# Patient Record
Sex: Female | Born: 1955 | Race: White | Hispanic: No | Marital: Married | Smoking: Never smoker
Health system: Southern US, Community
[De-identification: ages and names within clinical notes are randomized; demographics above are authoritative.]

## PROBLEM LIST (undated history)

## (undated) DIAGNOSIS — R5382 Chronic fatigue, unspecified: Secondary | ICD-10-CM

## (undated) DIAGNOSIS — K589 Irritable bowel syndrome without diarrhea: Secondary | ICD-10-CM

## (undated) DIAGNOSIS — K219 Gastro-esophageal reflux disease without esophagitis: Secondary | ICD-10-CM

## (undated) DIAGNOSIS — M549 Dorsalgia, unspecified: Secondary | ICD-10-CM

## (undated) DIAGNOSIS — E039 Hypothyroidism, unspecified: Secondary | ICD-10-CM

## (undated) DIAGNOSIS — K2950 Unspecified chronic gastritis without bleeding: Secondary | ICD-10-CM

## (undated) DIAGNOSIS — I10 Essential (primary) hypertension: Secondary | ICD-10-CM

## (undated) DIAGNOSIS — F329 Major depressive disorder, single episode, unspecified: Secondary | ICD-10-CM

## (undated) DIAGNOSIS — M797 Fibromyalgia: Secondary | ICD-10-CM

## (undated) DIAGNOSIS — R413 Other amnesia: Secondary | ICD-10-CM

## (undated) DIAGNOSIS — K222 Esophageal obstruction: Secondary | ICD-10-CM

## (undated) DIAGNOSIS — K760 Fatty (change of) liver, not elsewhere classified: Secondary | ICD-10-CM

## (undated) DIAGNOSIS — C73 Malignant neoplasm of thyroid gland: Secondary | ICD-10-CM

## (undated) DIAGNOSIS — F419 Anxiety disorder, unspecified: Secondary | ICD-10-CM

## (undated) DIAGNOSIS — G8929 Other chronic pain: Secondary | ICD-10-CM

## (undated) DIAGNOSIS — E119 Type 2 diabetes mellitus without complications: Secondary | ICD-10-CM

## (undated) DIAGNOSIS — G250 Essential tremor: Secondary | ICD-10-CM

## (undated) DIAGNOSIS — R61 Generalized hyperhidrosis: Secondary | ICD-10-CM

## (undated) DIAGNOSIS — F32A Depression, unspecified: Secondary | ICD-10-CM

## (undated) DIAGNOSIS — E785 Hyperlipidemia, unspecified: Secondary | ICD-10-CM

## (undated) DIAGNOSIS — G894 Chronic pain syndrome: Secondary | ICD-10-CM

## (undated) DIAGNOSIS — M3500 Sicca syndrome, unspecified: Secondary | ICD-10-CM

## (undated) HISTORY — PX: OTHER SURGICAL HISTORY: SHX169

## (undated) HISTORY — DX: Gastro-esophageal reflux disease without esophagitis: K21.9

## (undated) HISTORY — DX: Irritable bowel syndrome, unspecified: K58.9

## (undated) HISTORY — DX: Essential (primary) hypertension: I10

## (undated) HISTORY — DX: Dorsalgia, unspecified: M54.9

## (undated) HISTORY — DX: Gilbert syndrome: E80.4

## (undated) HISTORY — DX: Major depressive disorder, single episode, unspecified: F32.9

## (undated) HISTORY — DX: Chronic pain syndrome: G89.4

## (undated) HISTORY — DX: Essential tremor: G25.0

## (undated) HISTORY — DX: Malignant neoplasm of thyroid gland: C73

## (undated) HISTORY — DX: Other chronic pain: G89.29

## (undated) HISTORY — DX: Fatty (change of) liver, not elsewhere classified: K76.0

## (undated) HISTORY — DX: Type 2 diabetes mellitus without complications: E11.9

## (undated) HISTORY — DX: Depression, unspecified: F32.A

## (undated) HISTORY — DX: Esophageal obstruction: K22.2

## (undated) HISTORY — DX: Anxiety disorder, unspecified: F41.9

## (undated) HISTORY — DX: Unspecified chronic gastritis without bleeding: K29.50

## (undated) HISTORY — DX: Other amnesia: R41.3

## (undated) HISTORY — DX: Hyperlipidemia, unspecified: E78.5

## (undated) HISTORY — DX: Chronic fatigue, unspecified: R53.82

## (undated) HISTORY — PX: TONSILLECTOMY: SUR1361

## (undated) HISTORY — DX: Sjogren syndrome, unspecified: M35.00

## (undated) HISTORY — PX: LAPAROSCOPY: SHX197

## (undated) HISTORY — DX: Hypothyroidism, unspecified: E03.9

## (undated) HISTORY — DX: Generalized hyperhidrosis: R61

## (undated) HISTORY — DX: Fibromyalgia: M79.7

---

## 1980-01-02 HISTORY — PX: CHOLECYSTECTOMY: SHX55

## 1997-04-28 ENCOUNTER — Other Ambulatory Visit: Admission: RE | Admit: 1997-04-28 | Discharge: 1997-04-28 | Payer: Self-pay | Admitting: *Deleted

## 1998-04-06 ENCOUNTER — Ambulatory Visit (HOSPITAL_COMMUNITY): Admission: RE | Admit: 1998-04-06 | Discharge: 1998-04-06 | Payer: Self-pay | Admitting: Family Medicine

## 1998-04-06 ENCOUNTER — Encounter: Payer: Self-pay | Admitting: Family Medicine

## 1998-05-25 ENCOUNTER — Other Ambulatory Visit: Admission: RE | Admit: 1998-05-25 | Discharge: 1998-05-25 | Payer: Self-pay | Admitting: *Deleted

## 1999-05-25 ENCOUNTER — Other Ambulatory Visit: Admission: RE | Admit: 1999-05-25 | Discharge: 1999-05-25 | Payer: Self-pay | Admitting: *Deleted

## 1999-09-08 ENCOUNTER — Encounter: Payer: Self-pay | Admitting: Family Medicine

## 1999-09-08 ENCOUNTER — Ambulatory Visit (HOSPITAL_COMMUNITY): Admission: RE | Admit: 1999-09-08 | Discharge: 1999-09-08 | Payer: Self-pay | Admitting: Family Medicine

## 1999-12-05 ENCOUNTER — Ambulatory Visit (HOSPITAL_COMMUNITY): Admission: RE | Admit: 1999-12-05 | Discharge: 1999-12-05 | Payer: Self-pay | Admitting: Gastroenterology

## 2000-02-08 ENCOUNTER — Encounter: Admission: RE | Admit: 2000-02-08 | Discharge: 2000-02-08 | Payer: Self-pay | Admitting: Family Medicine

## 2000-02-08 ENCOUNTER — Encounter: Payer: Self-pay | Admitting: Family Medicine

## 2000-02-22 ENCOUNTER — Encounter: Payer: Self-pay | Admitting: Neurosurgery

## 2000-02-23 ENCOUNTER — Inpatient Hospital Stay (HOSPITAL_COMMUNITY): Admission: RE | Admit: 2000-02-23 | Discharge: 2000-02-27 | Payer: Self-pay | Admitting: Neurosurgery

## 2000-02-23 ENCOUNTER — Encounter: Payer: Self-pay | Admitting: Neurosurgery

## 2000-02-26 ENCOUNTER — Encounter: Payer: Self-pay | Admitting: Neurosurgery

## 2000-04-22 ENCOUNTER — Encounter: Payer: Self-pay | Admitting: Neurosurgery

## 2000-04-22 ENCOUNTER — Encounter: Admission: RE | Admit: 2000-04-22 | Discharge: 2000-04-22 | Payer: Self-pay | Admitting: Neurosurgery

## 2000-05-28 ENCOUNTER — Encounter: Admission: RE | Admit: 2000-05-28 | Discharge: 2000-05-28 | Payer: Self-pay | Admitting: Neurosurgery

## 2000-05-28 ENCOUNTER — Encounter: Payer: Self-pay | Admitting: Neurosurgery

## 2000-05-29 ENCOUNTER — Other Ambulatory Visit: Admission: RE | Admit: 2000-05-29 | Discharge: 2000-05-29 | Payer: Self-pay | Admitting: *Deleted

## 2000-06-05 ENCOUNTER — Emergency Department (HOSPITAL_COMMUNITY): Admission: EM | Admit: 2000-06-05 | Discharge: 2000-06-06 | Payer: Self-pay | Admitting: Emergency Medicine

## 2000-09-26 ENCOUNTER — Encounter: Admission: RE | Admit: 2000-09-26 | Discharge: 2000-09-26 | Payer: Self-pay | Admitting: Neurosurgery

## 2000-09-26 ENCOUNTER — Encounter: Payer: Self-pay | Admitting: Neurosurgery

## 2000-10-01 ENCOUNTER — Ambulatory Visit (HOSPITAL_COMMUNITY): Admission: RE | Admit: 2000-10-01 | Discharge: 2000-10-01 | Payer: Self-pay | Admitting: Family Medicine

## 2000-10-01 ENCOUNTER — Encounter: Payer: Self-pay | Admitting: Family Medicine

## 2001-02-12 ENCOUNTER — Encounter: Payer: Self-pay | Admitting: Family Medicine

## 2001-02-12 ENCOUNTER — Ambulatory Visit (HOSPITAL_COMMUNITY): Admission: RE | Admit: 2001-02-12 | Discharge: 2001-02-12 | Payer: Self-pay | Admitting: Family Medicine

## 2001-05-28 ENCOUNTER — Other Ambulatory Visit: Admission: RE | Admit: 2001-05-28 | Discharge: 2001-05-28 | Payer: Self-pay | Admitting: *Deleted

## 2002-10-19 ENCOUNTER — Other Ambulatory Visit: Admission: RE | Admit: 2002-10-19 | Discharge: 2002-10-19 | Payer: Self-pay | Admitting: *Deleted

## 2003-07-15 ENCOUNTER — Encounter: Admission: RE | Admit: 2003-07-15 | Discharge: 2003-07-15 | Payer: Self-pay | Admitting: Neurosurgery

## 2003-07-29 ENCOUNTER — Encounter: Admission: RE | Admit: 2003-07-29 | Discharge: 2003-07-29 | Payer: Self-pay | Admitting: Neurosurgery

## 2003-11-23 ENCOUNTER — Other Ambulatory Visit: Admission: RE | Admit: 2003-11-23 | Discharge: 2003-11-23 | Payer: Self-pay | Admitting: *Deleted

## 2003-12-03 ENCOUNTER — Encounter: Admission: RE | Admit: 2003-12-03 | Discharge: 2003-12-03 | Payer: Self-pay | Admitting: Neurosurgery

## 2004-01-02 DIAGNOSIS — K2950 Unspecified chronic gastritis without bleeding: Secondary | ICD-10-CM

## 2004-01-02 HISTORY — DX: Unspecified chronic gastritis without bleeding: K29.50

## 2004-01-07 ENCOUNTER — Ambulatory Visit: Payer: Self-pay | Admitting: Gastroenterology

## 2004-01-21 ENCOUNTER — Ambulatory Visit: Payer: Self-pay | Admitting: Gastroenterology

## 2004-04-19 ENCOUNTER — Encounter (INDEPENDENT_AMBULATORY_CARE_PROVIDER_SITE_OTHER): Payer: Self-pay | Admitting: Specialist

## 2004-04-19 ENCOUNTER — Ambulatory Visit (HOSPITAL_COMMUNITY): Admission: RE | Admit: 2004-04-19 | Discharge: 2004-04-19 | Payer: Self-pay | Admitting: Gastroenterology

## 2004-07-28 ENCOUNTER — Encounter: Admission: RE | Admit: 2004-07-28 | Discharge: 2004-07-28 | Payer: Self-pay | Admitting: Neurosurgery

## 2004-08-18 ENCOUNTER — Encounter: Admission: RE | Admit: 2004-08-18 | Discharge: 2004-08-18 | Payer: Self-pay | Admitting: Neurosurgery

## 2004-11-20 ENCOUNTER — Other Ambulatory Visit: Admission: RE | Admit: 2004-11-20 | Discharge: 2004-11-20 | Payer: Self-pay | Admitting: *Deleted

## 2005-01-01 DIAGNOSIS — C73 Malignant neoplasm of thyroid gland: Secondary | ICD-10-CM

## 2005-01-01 HISTORY — DX: Malignant neoplasm of thyroid gland: C73

## 2005-03-05 ENCOUNTER — Ambulatory Visit (HOSPITAL_COMMUNITY): Admission: RE | Admit: 2005-03-05 | Discharge: 2005-03-05 | Payer: Self-pay | Admitting: Neurosurgery

## 2005-03-06 ENCOUNTER — Ambulatory Visit (HOSPITAL_COMMUNITY): Admission: RE | Admit: 2005-03-06 | Discharge: 2005-03-06 | Payer: Self-pay | Admitting: Neurosurgery

## 2005-03-20 ENCOUNTER — Ambulatory Visit (HOSPITAL_COMMUNITY): Admission: RE | Admit: 2005-03-20 | Discharge: 2005-03-21 | Payer: Self-pay | Admitting: Neurosurgery

## 2005-08-01 ENCOUNTER — Other Ambulatory Visit: Admission: RE | Admit: 2005-08-01 | Discharge: 2005-08-01 | Payer: Self-pay | Admitting: Diagnostic Radiology

## 2006-01-15 ENCOUNTER — Encounter (INDEPENDENT_AMBULATORY_CARE_PROVIDER_SITE_OTHER): Payer: Self-pay | Admitting: Specialist

## 2006-01-15 ENCOUNTER — Ambulatory Visit (HOSPITAL_COMMUNITY): Admission: RE | Admit: 2006-01-15 | Discharge: 2006-01-15 | Payer: Self-pay | Admitting: *Deleted

## 2006-03-13 ENCOUNTER — Other Ambulatory Visit: Admission: RE | Admit: 2006-03-13 | Discharge: 2006-03-13 | Payer: Self-pay | Admitting: *Deleted

## 2007-11-21 ENCOUNTER — Ambulatory Visit: Payer: Self-pay | Admitting: Internal Medicine

## 2007-11-21 DIAGNOSIS — K219 Gastro-esophageal reflux disease without esophagitis: Secondary | ICD-10-CM | POA: Insufficient documentation

## 2007-11-21 DIAGNOSIS — J45909 Unspecified asthma, uncomplicated: Secondary | ICD-10-CM | POA: Insufficient documentation

## 2007-11-21 DIAGNOSIS — J309 Allergic rhinitis, unspecified: Secondary | ICD-10-CM | POA: Insufficient documentation

## 2007-12-06 DIAGNOSIS — I10 Essential (primary) hypertension: Secondary | ICD-10-CM | POA: Insufficient documentation

## 2007-12-06 DIAGNOSIS — E785 Hyperlipidemia, unspecified: Secondary | ICD-10-CM

## 2008-01-09 ENCOUNTER — Ambulatory Visit: Payer: Self-pay | Admitting: Internal Medicine

## 2008-10-27 ENCOUNTER — Encounter: Admission: RE | Admit: 2008-10-27 | Discharge: 2008-10-27 | Payer: Self-pay | Admitting: Family Medicine

## 2009-09-20 ENCOUNTER — Encounter: Admission: RE | Admit: 2009-09-20 | Discharge: 2009-09-20 | Payer: Self-pay | Admitting: Internal Medicine

## 2009-10-07 ENCOUNTER — Encounter: Admission: RE | Admit: 2009-10-07 | Discharge: 2009-10-07 | Payer: Self-pay | Admitting: Family Medicine

## 2010-01-01 HISTORY — PX: BLADDER SUSPENSION: SHX72

## 2010-01-22 ENCOUNTER — Encounter: Payer: Self-pay | Admitting: Neurosurgery

## 2010-05-19 NOTE — Discharge Summary (Signed)
[place]. [facility]  Patient:    Yvette Diaz, Yvette Diaz                        MRN: [id number] Adm. Date:  [id number] Disc. Date: [id number] Attending:  Danella Penton                           Discharge Summary  ADMISSION DIAGNOSIS:  L5-S1 degenerative disk disease with radiculopathy, status post L5-S1 diskectomy.  FINAL DIAGNOSES:  L5-S1 degenerative disk disease with radiculopathy, status post L5-S1 diskectomy.  HISTORY OF PRESENT ILLNESS:  Ms. Udovich is a lady who underwent L5-S1 diskectomy many years ago by a neurosurgeon who retired since then.  She has been complaining of back pain with radiation to both lower extremities. X-rays showed that indeed she had no space between 5-1 and there was foraminal stenosis.  Surgery was advised.  LABORATORY DATA:  Normal.  HOSPITAL COURSE:  The patient was taken to surgery with bilateral L5-S1 diskectomy with interval fusion and pedicle screw was done.  Today the patient is doing much better.  She is ambulating and she is ready to go home. Pain is under control.  CONDITION ON DISCHARGE:  Improved.  DISCHARGE MEDICATIONS: 1. Percocet. 2. Diazepam.  She is going to use Dilaudid 2 mg only 20 tablets for extreme pain.  She knows to be careful about that and I explained about overdose.  FOLLOW-UP:  I will see her next week in my office.  DIET:  She is encouraged to lose weight.  ACTIVITY:  She is not to drive, but she was encouraged to walk. DD:  02/27/00 TD:  02/27/00 Job: [id number]

## 2010-05-19 NOTE — Procedures (Signed)
[place]. [facility]  Patient:    ELIN, FENLEY                        MRN: [id number] Proc. Date: 12/05/99 Adm. Date:  [id number] Disc. Date: [id number] Attending:  Orland Mustard CC:         Arvella Merles, M.D.   Procedure Report  DATE OF BIRTH:  Jul 04, 1955.  PROCEDURE PERFORMED:  Esophageal manometry.  ENDOSCOPIST:  Llana Aliment. Randa Evens, M.D.  INDICATIONS FOR PROCEDURE:  Burning in the throat, stomach and nausea, refractory to acid reduction.  DESCRIPTION OF PROCEDURE:  The procedure was performed in the [place] manometry lab using the usual protocol and [employer] equipment. Results are as follows:  1. Upper esophageal sphincter.  Positive pharyngeal spikes were present.  We    were unable to see the upper sphincter on the material provided for [place]. 2. Mean amplitude distal esophagus was 280 mmHg with a mean duration of 5.8    seconds, peristalsis was normal in all wet swallows presented. 3. Lower esophageal sphincter.  Mean pressure 50 mm.  The sphincter relaxes    with swallowing.  Residual pressure 2.2 mm, well within normal range.  ASSESSMENT:  Essentially normal manometry.  PLAN:  24-hour pH probe.  Follow-up office visit. DD:  12/13/99 TD:  12/13/99 Job: [id number]

## 2010-05-19 NOTE — Procedures (Signed)
[place]. [facility]  Patient:    Yvette Diaz, Yvette Diaz                        MRN: [id number] Proc. Date: 12/05/99 Adm. Date:  [id number] Disc. Date: [id number] Attending:  Orland Mustard CC:         Arvella Merles, M.D.   Procedure Report  DATE OF BIRTH:  Nov 06, 1955.  PROCEDURE:  Twenty-four-hour esophageal pH monitor.  ENDOSCOPIST:  Llana Aliment. Randa Evens, M.D.  INDICATION:  Patient has symptoms somewhat suggestive of reflux.  This is done to rule out esophageal reflux.  Patient had an esophageal manometry done prior to this procedure.  DESCRIPTION OF PROCEDURE:  The procedure was done according to the [facility] protocol.  The patient retained the 24-hour pH probe placed by manometric guidance into the esophagus for a total of 23 hours and 45 minutes.  Results were as follows: 1. Fifty-four episodes of reflux in the upright position for a total of    17 minutes during 11 hours and 24 minutes of upright position, a total of    2.5% of the time, within normal range. 2. Thirty episodes during 12 hours and 21 minutes in the supine position, no    episode lasting over 5 minutes, a total of 19 minutes of reflux, 2.5% of    the time, slightly outside of the normal range. 3. Total:  Eighty-four episodes of reflux 2.5% of the time, none lasting over    5 minutes, a total of 36 minutes of reflux.  The longest episode --    2.8 minutes.  All these composite Johnson-Deemster score of 15, with the    normal being less than 22.  ASSESSMENT:  This 24-hour pH probe study indicates patient does have some esophageal reflux but well within the normal range.  Her reflux is slightly out of the normal range in the supine position and would indicate that aggressive therapy while supine would probably be appropriate.  She will come back to the office and discuss these results. DD:  12/13/99 TD:  12/13/99 Job: [id number]

## 2010-05-19 NOTE — Op Note (Signed)
[place]. [facility]  Patient:    Yvette Diaz, Yvette Diaz                       MRN: [id number] Proc. Date: 02/23/00 Adm. Date:  02/23/00 Attending:  Tanya Nones. Jeral Fruit, M.D.                           Operative Report  PREOPERATIVE DIAGNOSES:  Chronic back pain, degenerative disk disease, L5-S1, with a chronic S1 radiculopathy, and status post right L5-S1 diskectomy, hypertrophy of the facet.  POSTOPERATIVE DIAGNOSES:  Chronic back pain, degenerative disk disease, L5-S1, with a chronic S1 radiculopathy, and status post right L5-S1 diskectomy, hypertrophy of the facet.  PROCEDURE:  Bilateral L5-S1 diskectomy, bilateral facetectomy, interbody fusion using bone graft, pedicle screws from L5 through S1 bilaterally, posterolateral fusion using autograft plus Vitoss.  SURGEON:  Tanya Nones. Jeral Fruit, M.D.  ASSISTANT:  Danae Orleans. Venetia Maxon, M.D.  CLINICAL HISTORY:  Yvette Diaz is a 55 year old female who had been complaining of back pain with radiation down to both legs and which is getting worse lately.  The patient tells me that the pain is from her back down to the legs to sometimes below the knee.  About 12 to 14 years ago, she had L5-S1 diskectomy on the right side.  MRI showed that indeed this lady has a severe case of advanced degenerative disk disease at the level of 5-1 up to the point that there is no disk.  There is overgrowth of the facet with a foraminal stenosis.  Flexion-extension did not show any abnormalities.  We talked about conservative treatment and surgery.  The patient knew that complete foraminotomy would be temporary relief.  In the end, we agreed with total diskectomy and fusion.  The patient knew about the risks, such as failure of the hardware, poor take of the bone graft, need for further surgery, infection, CSF leak.  DESCRIPTION OF PROCEDURE:  The patient was taken to the OR and placed on the operating table in a prone manner.  The back was  prepped with Betadine.  The Cell Saver was used.  The previous scar was removed, and the wound was made a bit longer.  From then on, muscle and fascia were retracted laterally.  We identified the area where she had the surgery of the level of 5-1 to the right side.  Immediately using the drill, we drilled up upper part of the S1 and the lower lamina of L5, and this procedure was done bilaterally.  We found in the left side quite a bit of adhesion.  Lysis was accomplished.  Then we went laterally, and we removed the facet.  At the end, we had plenty of decompression of the L5 and S1 nerve root.  The most difficult part here was to enter into the disk space.  We drilled laterally and using the different types of microcurettes, we were able to enter the disk space with removal of some degenerative disk.  Then we were able to introduce a spacer, and from then on we went on to remove the disk bilaterally.  We worked our way up to the point that we were afraid we were unable to put any bone graft in between the L5-S1, but at the end we were able to introduce two pieces of bone graft with 9 mm height in that area.  Using autograft as well as Vitoss, the space between  both bone grafts was filled with disk material.  This introduction of the bone was done using the C-arm machine.  Then we went laterally.  We identified the pedicles of L5 as well as the facet.  Using the C-arm to guide [place], we introduced a pedicle probe at the pedicles of L5 and S1.  The probes were in good position.  From then on, using the Synthes, we introduced screws at the pedicle of L5 bilaterally and then at the pedicle of S1.  Good position was achieved.  Then a rod with mild lordosis was inserted.  The four Synthes pedicle screws were put together using the caps.  Laterally as well as AP views showed good position of the pedicle screws as well as the bone graft. From then on, we went laterally and we drilled the spinous  process of L5-S1. Using the remnants of bone graft, we went lateral to fill the area.  From then on, the area was irrigated.  The wound was closed with Vicryl and nylon.  The patient did well. DD:  02/23/00 TD:  02/26/00 Job: [id number]

## 2010-05-19 NOTE — H&P (Signed)
Yvette Diaz, WREDE NO.:  [id number]   MEDICAL RECORD NO.:  [id number]          PATIENT TYPE:  INP   LOCATION:  3036                         FACILITY:  [facility]   PHYSICIAN:  Hilda Lias, M.D.   DATE OF BIRTH:  1955/02/23   DATE OF ADMISSION:  03/20/2005  DATE OF DISCHARGE:                                HISTORY & PHYSICAL   [name] is a lady who underwent lumbar fusion many years ago.  Lately she  had been complaining of neck pain with radiation to both upper extremities  which is getting worse.  We have an x-ray, MRI from last year which shows  only spondylosis at the level 5-6, 6-7.  Because of the worsening of pain,  patient wanted to proceed with surgery.  The pain is going to both arms  right worse than the left one.   PAST MEDICAL HISTORY:  1.  She had a lumbar fusion.  2.  Tonsillectomy.  3.  Hysterectomy.   She is allergic to SULFA, PREDNISONE, and IODINE.   SOCIAL HISTORY:  Negative.   REVIEW OF SYSTEMS:  Positive for sinus headache, high blood pressure,  asthma, and high liver enzymes secondary to fatty liver.   PHYSICAL EXAMINATION:  HEENT:  Normal.  NECK:  She is able to ____________ discomfort going to both shoulders.  LUNGS:  Clear.  HEART:  Normal.  ABDOMEN:  Normal.  EXTREMITIES:  Normal pulses.  NEUROLOGIC:  Mental status normal.  Cranial nerves normal.  Strength shows  some weakness of the biceps and wrist extensor with some mild weakness of  the triceps.  Reflexes symmetrical at the ____________biceps___________.  Coordination and gait normal.   Cervical x-rays showed that she has a spondylosis at the level 5-6, 6-7 with  foraminal stenosis.   CLINICAL IMPRESSION:  Chronic neck pain with cervical spondylosis.   RECOMMENDATIONS:  The patient is being admitted for anterior cervical  diskectomy at the level 5-6, 6-7.  She knows about the risks such as  infection, CSF leak, worsening pain, paralysis, need for further surgery,  and failure of the hardware.           ______________________________  Hilda Lias, M.D.     EB/MEDQ  D:  03/20/2005  T:  03/20/2005  Job:  [id number]

## 2010-05-19 NOTE — Op Note (Signed)
Yvette Diaz, Yvette Diaz                 ACCOUNT NO.:  [id number]   MEDICAL RECORD NO.:  [id number]          PATIENT TYPE:  AMB   LOCATION:  ENDO                         FACILITY:  [facility]   PHYSICIAN:  John C. Madilyn Fireman, M.D.    DATE OF BIRTH:  1955-11-01   DATE OF PROCEDURE:  04/19/2004  DATE OF DISCHARGE:                                 OPERATIVE REPORT   PROCEDURE:  Esophagogastroduodenoscopy with biopsy.   INDICATIONS FOR PROCEDURE:  Epigastric bilateral upper quadrant abdominal  pain with postprandial diarrhea. She did not respond to antispasmodics and  has had cholecystectomy in the remote past. Procedure is to rule out peptic  ulcer disease of the upper GI tract and also obtain biopsies to rule out  celiac disease or H.pylori given that she has had significant diarrhea in  addition to her abdominal pain.   PROCEDURE:  The patient was placed in the left lateral decubitus position  and placed on pulse monitor with continuous low-flow oxygen delivered by  nasal cannula. She was sedated with 50 mcg IV fentanyl and 7.5 mg IV Versed.  The Olympus video endoscope was advanced under direct vision to the  oropharynx, esophagus. The esophagus was straight and of normal caliber. At  the squamocolumnar line at 38 cm there was no visible hiatal hernia, ring,  stricture or other abnormalities of the GE junction. The stomach was entered  and a small amount of liquid secretions were suctioned from the fundus.  Retroflexed view of cardia was unremarkable. The fundus and body appeared  unremarkable and the antrum showed parallel streaks of erythema radiating up  the pylorus to about the junction of the body and antrum consistent with  gastritis. There were no focal erosions, ulcers. CLO-test was obtained. The  pyloris was nondeformed and easily allowed passage of the endoscope tip to  the duodenum. Both bulb and second portion were well inspected and appeared  to be within normal limits. Biopsies were  taken to rule out celiac disease.  The scope was then withdrawn and the patient returned to the recovery room  in stable condition. She tolerated procedure well with no immediate  complications.   IMPRESSION:  Antral gastritis otherwise normal study.   PLAN:  Await biopsy and CLO-test. If unrevealing will consider Robamol or  possibly Questran.      JCH/MEDQ  D:  04/19/2004  T:  04/19/2004  Job:  [id number]   cc:   Holley Bouche, M.D.  [address]  [place], [place] [address]  Fax: [phone]

## 2010-05-19 NOTE — Op Note (Signed)
NAMEDAZIYA, REDMOND NO.:  [id number]   MEDICAL RECORD NO.:  [id number]          PATIENT TYPE:  INP   LOCATION:  3036                         FACILITY:  [facility]   PHYSICIAN:  Hilda Lias, M.D.   DATE OF BIRTH:  06-20-1955   DATE OF PROCEDURE:  03/20/2005  DATE OF DISCHARGE:                                 OPERATIVE REPORT   PREOPERATIVE DIAGNOSIS:  C5-C6, C6-C7 spondylosis with degenerative disc  disease and radiculopathy.   POSTOPERATIVE DIAGNOSIS:  C5-C6, C6-C7 spondylosis with degenerative disc  disease and radiculopathy.   PROCEDURE:  Anterior 5-7, 6-7 discectomy, decompression of spinal cord,  bilateral foraminotomy, interbody fusion with allograft plate, microscope.   SURGEON:  Hilda Lias, M.D.   ASSISTANT:  Cristi Loron, M.D.   CLINICAL HISTORY:  The patient was admitted because of neck pain with  radiation to both upper extremities.  I have been following this lady for  many years and she is getting worse.  X-ray shows spondylosis at the level  of 5-6, 6-7.  Surgery was advised and the risks were explained in the  history and physical.   DESCRIPTION OF PROCEDURE:  The patient was taken to the OR and the neck was  prepped.  Transverse incision was made through the skin, platysma, down to  the cervical spine.  Patient has a short neck and we put our needle above.  The needle was below 4-5.  From then on, went into 5-6 which had a large  anterior osteophyte which was removed.  We opened the anterior ligament at  the level of 5-6, 6-7 which were calcified.  With the microcurette and the  help of the microscope, we developed total discectomy.  We drilled the  posterior edges of the vertebral bodies and we opened the posterior ligament  with decompression both C6 nerve roots.  The same procedure was done at the  level of C6-7 which is probably worse than the level 5-6.  Nevertheless, at  the end, we have good decompression of the spinal cord  as well as the C6 and  C7 nerve root.  Plenty of room was left for the nerve root.  Then the end  plates were drilled and two pieces of allograft 7 mm with autograft inside  was inserted followed by a plate using six screws.  __________ only showed  the top of C5.  From then on, the area was irrigated.  We waited about 10  minutes just to be sure there was plenty of hemostasis.  Once the area was  dry, we closed with Vicryl and Steri-Strips.           ______________________________  Hilda Lias, M.D.     EB/MEDQ  D:  03/20/2005  T:  03/21/2005  Job:  [id number]

## 2010-05-19 NOTE — H&P (Signed)
NAMESHAMARRA, WARDA                 ACCOUNT NO.:  [id number]   MEDICAL RECORD NO.:  [id number]         PATIENT TYPE:  LAMB   LOCATION:                                FACILITY:  [facility]   PHYSICIAN:  Almedia Balls. Fore, M.D.        DATE OF BIRTH:   DATE OF ADMISSION:  01/15/2006  DATE OF DISCHARGE:                              HISTORY & PHYSICAL   CHIEF COMPLAINT:  Pain, left ovarian cyst.   HISTORY:  The patient is a 55 year old status post vaginal hysterectomy  in 1991 for dysmenorrhea with benign pathology, who has over the past  several months complained of increasingly severe pain in her left  adnexal area.  Ultrasounds have been performed which show a  persistence  of a left ovarian cyst which is tender.  She is admitted at this time  for laparoscopy, possible exploratory laparotomy, possible bilateral  salpingo-oophorectomy.  She has been fully counseled as to the nature of  the procedure and the risks involved to include risks of anesthesia,  injury to bowel, bladder, blood vessels, ureters, postoperative  hemorrhage, infection, recuperation, and continuance of her hormone  replacement therapy following surgery.  The CA-125 was 8.1 units per mL  in September 2007, which is within normal limits.   PAST MEDICAL HISTORY:  Includes tonsillectomy in the past, surgery in  the lumbosacral spine x3, and cervical spine once for disk problems.  She had an open cholecystectomy and a recent thyroidectomy for thyroid  cancer for which she has had replacement of levothyroxine at 150 mcg per  day.  She also has fibromyalgia and takes a number medications for this.   Her medications include:  1. Hydrocodone/APAP  5/500 mg.  2. Oxycodone 5 mg.  3. Cyclobenzaprine 10 mg.  4. Effexor 150 mg once daily.  5. Avapro 300 mg daily.  6. Indapamide 1.25 mg once daily.  7. Imitrex p.r.n.  8. Prevacid 30 mg daily for GERD.  9. Singulair 10 mg once daily for asthma prevention.  10.Alupent 2 puffs 4 times  a day as needed for asthma.  11.Plus vitamins.  11  Zocor 40 mg daily.  1. Cymbalta 30 mg daily is also taken.   She is ALLERGIC TO AND SENSITIVE TO PREDNISONE, IODINE, SEAFOOD, AND  SULFA.   FAMILY HISTORY:  Noncontributory   REVIEW OF SYSTEMS:  HEENT:  Headaches which are chronic.  CARDIORESPIRATORY:  As noted above with asthma problems.  GASTROINTESTINAL:  Cholecystectomy as noted above and GERD.  GENITOURINARY:  As in present illness.  NEUROMUSCULAR:  As noted above.   PHYSICAL EXAMINATION:  Height 5 feet 5 inches.  Weight 205 pounds.  Blood pressure 120/74.  Pulse 80.  Respirations 18.  GENERAL:  Well-developed, white female in no acute distress.  HEENT:  Within normal limits.  NECK:  Supple without masses, adenopathy, or bruits.  Status post  thyroidectomy.  LUNGS:  Clear to P and A.  HEART:  Regular rate and rhythm without murmurs.  BREASTS:  Symmetrical without masses.  Axillae negative.  ABDOMEN:  Soft, somewhat tender in  the left lower quadrant without  palpable mass.  PELVIC:  External genitalia,  Bartholin's, urethra, and Skene's glands  within normal limits.  Vagina is clean.  Cervix and uterus are  previously surgically absent.  There is fullness and tenderness on the  left adnexal area; right without mass and only slightly tender.  RECTOVAGINAL:  Confirmatory.  EXTREMITIES:  Within normal limits.  CENTRAL NERVOUS SYSTEM: Grossly intact.  SKIN:  Without suspicious lesions.   IMPRESSION:  1. Persisting left ovarian cyst.  2. Pelvic pain.   DISPOSITION:  As noted above.           ______________________________  Almedia Balls. Randell Patient, M.D.     SRF/MEDQ  D:  01/08/2006  T:  01/08/2006  Job:  [id number]

## 2010-05-19 NOTE — Op Note (Signed)
Yvette Diaz, FONG                 ACCOUNT NO.:  [id number]   MEDICAL RECORD NO.:  [id number]          PATIENT TYPE:  AMB   LOCATION:  DAY                          FACILITY:  [facility]   PHYSICIAN:  Almedia Balls. Fore, M.D.   DATE OF BIRTH:  1955-10-19   DATE OF PROCEDURE:  01/15/2006  DATE OF DISCHARGE:                               OPERATIVE REPORT   PREOPERATIVE DIAGNOSIS:  Pelvic pain, persisting left adnexal cyst.   POSTOPERATIVE DIAGNOSIS:  Pelvic pain, persisting left adnexal cyst,  pending pathology.   OPERATION:  Laparoscopy with excision of left adnexal cyst.   ANESTHESIA:  General orotracheal.   SURGEON:  Dr. Jeanine Luz.   FIRST ASSISTANT:  Dr. Beather Arbour.   INDICATIONS FOR SURGERY:  The patient is a 55 year old with the above-  noted problems who was counseled as to the need for surgery to treat  these problems and the type of procedure to be performed.  She was fully  counseled as to the nature of the procedure and risks involved to  include risks of anesthesia, injury to bowel, bladder, blood vessels,  ureters, postoperative hemorrhage, infection, recuperation.  She fully  understands all these considerations and has signed informed consent to  proceed on January 15, 2006.   OPERATIVE FINDINGS:  On laparoscopy, evaluation of the liver was not  possible because of the dense adhesions secondary to previous  cholecystectomy.  The spleen appeared normal.  Loops of bowel were  totally free. In the pelvis, the uterus was previously surgically  absent. The right ovary was atrophic and normal appearance.  On the  left, the major portion of the ovary was atrophic and appeared normal.  On the distal pole, there was a cystic structure coming off the tube at  approximately 6-7 cm in greatest dimensions and later proved to have  serous fluid within it.   DESCRIPTION OF PROCEDURE:  With the patient under general anesthesia,  prepared and draped in the usual sterile fashion, a  Foley catheter was  placed in the bladder, and the sponge stick was placed in the vagina.  The patient was prepared for a laparoscopic procedure.  An incision was  made in the lower pole of the umbilicus, and the Veress cannula was  inserted into the peritoneal cavity with insufflation of 4 liters of  carbon dioxide.  The disposable 10/11 trocar for the laparoscope and  scope itself were then inserted into the peritoneal cavity without  difficulty. A disposable 5-mm probe was inserted through a stab wound  above the symphysis pubis well above the bladder reflection.  The above-  noted findings were visualized.  The cyst was grasped and aspirated with  serous fluid being obtained which was sent for cytology.  The base of  the cyst was then rendered hemostatic with bipolar electrocoagulation  and then transected.  The cystic structures were then removed through  the 10 port without difficulty and sent to pathology.  The area was  inspected for hemostasis, and irrigated with copious amounts of normal  saline solution.  After noting that hemostasis was maintained  following  a reduction of intra-abdominal pressure by allowing gas to escape and  that sponge and instrument counts were correct, the instruments were  removed from the peritoneal cavity.  Gas was allowed to fully escape,  the incisions were closed with fascial sutures of #0 Vicryl, and  subcuticular sutures of 3-0 plain catgut.  Estimated blood loss less  than 25 mL.  The patient was taken to the recovery room in good  condition with clear urine in the Foley catheter tubing.  She will be  discharged following PACU if vital signs are stable.   FOLLOW-UP CARE:  She is to return to the office in 2 weeks for follow-up  and to call if unusual pain or unexplained fever should ensue.  She has  Hydrocodone at home for pain and will take that.           ______________________________  Almedia Balls. Randell Patient, M.D.     SRF/MEDQ  D:  01/15/2006   T:  01/15/2006  Job:  [id number]   cc:   Gretta Cool, M.D.  Fax: [phone]

## 2010-08-02 ENCOUNTER — Ambulatory Visit (INDEPENDENT_AMBULATORY_CARE_PROVIDER_SITE_OTHER): Payer: 59 | Admitting: Gynecology

## 2010-08-02 ENCOUNTER — Encounter: Payer: Self-pay | Admitting: Gynecology

## 2010-08-02 ENCOUNTER — Other Ambulatory Visit: Payer: Self-pay | Admitting: *Deleted

## 2010-08-02 ENCOUNTER — Other Ambulatory Visit (HOSPITAL_COMMUNITY)
Admission: RE | Admit: 2010-08-02 | Discharge: 2010-08-02 | Disposition: A | Discharge status: Home / Self Care | Payer: 59 | Source: Ambulatory Visit | Attending: Gynecology | Admitting: Gynecology

## 2010-08-02 VITALS — BP 124/60 | Ht 64.25 in | Wt 189.0 lb

## 2010-08-02 DIAGNOSIS — Z01419 Encounter for gynecological examination (general) (routine) without abnormal findings: Secondary | ICD-10-CM | POA: Insufficient documentation

## 2010-08-02 DIAGNOSIS — N8111 Cystocele, midline: Secondary | ICD-10-CM

## 2010-08-02 DIAGNOSIS — N951 Menopausal and female climacteric states: Secondary | ICD-10-CM

## 2010-08-02 DIAGNOSIS — N952 Postmenopausal atrophic vaginitis: Secondary | ICD-10-CM

## 2010-08-02 DIAGNOSIS — N3941 Urge incontinence: Secondary | ICD-10-CM

## 2010-08-02 MED ORDER — ESTRADIOL 0.05 MG/24HR TD PTTW: 1.0000 | MEDICATED_PATCH | TRANSDERMAL | Status: DC

## 2010-08-02 NOTE — Progress Notes (Signed)
Yvette Diaz 1955-03-21 [id number]   History:    55 y.o.  for annual exam complaining of menopausal symptoms to include heart flushes and sweats. She had previously been on Vivelle patches did well with these but was taken off of them by her gynecologist stating that it was time to come off them. She does have a number of medical issues is being followed for it she sees a primary for these. She is status post hysterectomy for endometriosis. She has historically had a bone density done a number of years ago is not sure of the results and have her mammogram at [facility] this year and a colonoscopy 6 years ago  Past medical history,surgical history, family history and social history were all reviewed and documented in the EPIC chart. ROS:  Was performed and pertinent positives and negatives are included in the history.  Exam: chaperone present Filed Vitals:   08/02/10 1028  BP: 124/60   General appearance  Normal Skin grossly normal Head/Neck normal with no cervical or supraclavicular adenopathy thyroid normal Lungs  clear Cardiac RR, without RMG Abdominal  soft, nontender, without masses, guarding, rebound or organomegaly  Breasts  examined lying and sitting without masses, retractions, discharge or axillary adenopathy. Pelvic  Ext/BUS/vagina  Mild atrophic changes with first degree cystocele  Cervix  absent  Uterus  absent  Adnexa  Without masses or tenderness  Anus and perineum  normal   Rectovaginal  normal sphincter tone without palpated masses or tenderness.   Assessment/Plan:  55 y.o. female for annual exam . #1 Complaining of menopausal symptoms night sweats hot flashes. Had been on Vivelle patches before include well with these. She does have a sister with breast cancer but no other family history. I discussed the options for her symptoms to include soy which she has tried and did not seem to help, non-hormonal pharmacologic such as Effexor or older she is on Cymbalta and hormone  replacement therapy. I discussed the WHI study, ACOG and NAMS statements the lowest dose for shortest period of time, the issues of transdermal versus oral first pass effect in the issues of increased risk of stroke heart attack DVT and breast cancer risk issues all reviewed. After lengthy discussion she wants to reinitiate Vivelle dot patches which I think is reasonable given how her symptoms are interfering with her life and I prescribed Vivelle 0.05 patches 2 times weekly refill times a year. She will follow up with me if she does not get adequate relief or if she has any other issues. #2 mild cystocele history of urinary incontinence. She is on Vesicare for urgency symptoms but doesn't seem to having a good relief with this. She and she has a urologist in [facility] and have recommended she follow up with them to discuss this further. #3 She has had a bone density several years ago not sure of the results recommend we go ahead and schedule back to baseline now and recheck a vitamin D level. No other blood work was done was all done through her primary who follows her for her other medical issues. #4 Gen. Health maintenance self breast exams on a monthly basis discussed urged she historically is up-to-date with mammograms and colonoscopies will continue to see her primary for routine healthcare. Assuming she continues well she'll see [place] in a year sooner as needed    Dara Lords MD, 11:45 AM 08/02/2010

## 2010-08-03 LAB — VITAMIN D 25 HYDROXY (VIT D DEFICIENCY, FRACTURES): Vit D, 25-Hydroxy: 55 ng/mL (ref 30–89)

## 2010-08-04 ENCOUNTER — Encounter: Payer: Self-pay | Admitting: Gynecology

## 2010-08-22 ENCOUNTER — Encounter: Payer: Self-pay | Admitting: Gastroenterology

## 2010-08-22 ENCOUNTER — Telehealth: Payer: Self-pay | Admitting: Internal Medicine

## 2010-08-22 NOTE — Telephone Encounter (Signed)
Please obtain records from Dr Evergreen Bing first

## 2010-08-22 NOTE — Telephone Encounter (Signed)
ok 

## 2010-08-22 NOTE — Telephone Encounter (Signed)
Dr Juanda Chance, will you accept the pt? Thanks.

## 2010-08-22 NOTE — Telephone Encounter (Signed)
Dr Jarold Motto, pt had a screening COLON in Jan. 2006 by you in [place] per [facility], but everything else I found was by Dr Madilyn Fireman in 2008. Is it ok for pt to switch to Dr Juanda Chance? Thanks.

## 2010-08-23 NOTE — Telephone Encounter (Signed)
lmom for Yvette Diaz to call back if needed; lm that pt needs to get records from Dr Madilyn Fireman.

## 2010-08-24 ENCOUNTER — Ambulatory Visit (INDEPENDENT_AMBULATORY_CARE_PROVIDER_SITE_OTHER): Payer: 59 | Admitting: Gynecology

## 2010-08-24 DIAGNOSIS — N951 Menopausal and female climacteric states: Secondary | ICD-10-CM

## 2010-08-24 DIAGNOSIS — Z13820 Encounter for screening for osteoporosis: Secondary | ICD-10-CM

## 2010-08-29 NOTE — Telephone Encounter (Signed)
Received faxes from [facility] at Dr Tiburcio Pea' office for [facility] on 04/19/2004 and associated biopsy report. lmom for [facility] requesting we get office notes and/or something informin [place] if pt was compliant.

## 2010-09-01 NOTE — Telephone Encounter (Signed)
Dr Juanda Chance, chart in on your desk for review. Thanks, Aram Beecham.

## 2010-09-03 NOTE — Telephone Encounter (Signed)
I will review when I get back to town

## 2010-09-05 NOTE — Telephone Encounter (Signed)
Dr Juanda Chance has agreed to accept pt. Yvette Diaz for pt to call back to schedule an appt with Dr Juanda Chance.

## 2010-09-06 ENCOUNTER — Encounter: Payer: Self-pay | Admitting: *Deleted

## 2010-09-06 NOTE — Telephone Encounter (Signed)
Lm for Talbert Forest at [employer] to call me back.

## 2010-09-06 NOTE — Telephone Encounter (Signed)
Pt called back and is scheduled to see Dr Juanda Chance on 09/25/10 at 2:30pm. Mailed NP assessment form to her.

## 2010-09-14 ENCOUNTER — Ambulatory Visit: Payer: 59 | Admitting: Gastroenterology

## 2010-09-25 ENCOUNTER — Ambulatory Visit (INDEPENDENT_AMBULATORY_CARE_PROVIDER_SITE_OTHER): Payer: 59 | Admitting: Internal Medicine

## 2010-09-25 ENCOUNTER — Encounter: Payer: Self-pay | Admitting: Internal Medicine

## 2010-09-25 DIAGNOSIS — R1011 Right upper quadrant pain: Secondary | ICD-10-CM

## 2010-09-25 DIAGNOSIS — K589 Irritable bowel syndrome without diarrhea: Secondary | ICD-10-CM

## 2010-09-25 DIAGNOSIS — K5900 Constipation, unspecified: Secondary | ICD-10-CM

## 2010-09-25 MED ORDER — POLYETHYLENE GLYCOL 3350 17 GM/SCOOP PO POWD: ORAL | Status: DC

## 2010-09-25 MED ORDER — LUBIPROSTONE 24 MCG PO CAPS: 24.0000 ug | ORAL_CAPSULE | Freq: Two times a day (BID) | ORAL | Status: AC

## 2010-09-25 MED ORDER — PEG-KCL-NACL-NASULF-NA ASC-C 100 G PO SOLR: 1.0000 | Freq: Once | ORAL | Status: DC

## 2010-09-25 NOTE — Progress Notes (Signed)
Yvette Diaz Nov 26, 1955 MRN [id number]    History of Present Illness:  This is a 55 year old white female with chronic constipation and bloating as well as right upper quadrant abdominal pain shortly after eating. This has been occurring for several years. She was evaluated by several gastroenterologists. An upper endoscopy in April 2006 by Dr. Madilyn Fireman showed antral gastritis which was H. pylori negative. A consultation was completed in January 2007 by Dr. Kinnie Scales. Her last colonoscopy by Dr. Jarold Motto in 2006 was normal. A CT scan of the abdomen in 2007 was negative. She has fibromyalgia. Recently, disability was granted on the basis of fibromyalgia. She has been on oxycodone and hydrocodone. She takes MiraLax 17 g when necessary as well as ex-lax. She takes also glycerin suppositories.   Past Medical History  Diagnosis Date  . Fibromyalgia   . Chronic fatigue   . Chronic back pain   . IBS (irritable bowel syndrome)   . Hypothyroid   . Hypertension   . Diabetes mellitus type II     controlled with diet  . Antral gastritis 2006  . GERD (gastroesophageal reflux disease)   . Esophageal stricture   . Depression   . Gilbert's syndrome   . Asthma   . Fatty liver   . Hyperlipidemia   . Tremor, essential   . Papillary thyroid carcinoma    Past Surgical History  Procedure Date  . Back surgeries     2 lumbar and 1 cervical   . Lavh     30'S  . Thyroid removed   . Cholecystectomy 1982  . Abdominal hysterectomy     partial  . Tonsillectomy   . Laparoscopy     x 3    reports that she has never smoked. She has never used smokeless tobacco. She reports that she does not drink alcohol or use illicit drugs. family history includes Breast cancer in her sister; Diabetes in her brother, father, and sister; Heart disease in her father; Hypertension in her brother, father, mother, and sister; Osteoporosis in her sister; and Rheum arthritis in her sister. Allergies  Allergen Reactions  .  Iodine     Fish and shrimp  . Prednisone   . Sulfonamide Derivatives         Review of Systems: Gastroesophageal reflux partially controlled on Prevacid. He denies rectal bleeding positive for abdominal pain  The remainder of the 10  point ROS is negative except as outlined in H&P   Physical Exam: General appearance  Well developed, in no distress. Eyes- non icteric. HEENT nontraumatic, normocephalic. Mouth no lesions, tongue papillated, no cheilosis. Neck supple without adenopathy, thyroid not enlarged, no carotid bruits, no JVD. Lungs Clear to auscultation bilaterally. Cor normal S1 normal S2, regular rhythm , no murmur,  quiet precordium. Abdomen soft abdomen tender in right upper quadrant along the right costal margin. No CVA tenderness. Liver not enlarged. No ascites. Post-open cholecystectomy scar in right upper quadrant. Rectal: Soft Hemoccult negative stool. Extremities no pedal edema. Skin no lesions. Neurological alert and oriented x 3. Psychological normal mood and affect.  Assessment and Plan:  Problem #1 Chronic right upper quadrant abdominal discomfort possibly related to adhesions or to irritable bowel syndrome. A basic GI workup 6 years ago was negative. We will repeat a colonoscopy at this time and start patient on Amitiza 24 mcg twice a day in addition to MiraLax 17 g daily. She needs to improve her fiber intake, especially in the mornings. We would consider a small  bowel follow through to assess her small bowel motility.  09/25/2010 Lina Sar

## 2010-09-25 NOTE — Patient Instructions (Signed)
You have been scheduled for a colonoscopy. Please follow written instructions given to you at your visit today.  Please pick up your Moviprep kit at the pharmacy within the next 2-3 days. We have sent the following medications to your pharmacy for you to pick up at your convenience: Amitiza 24 mcg. Take 1 tablet by mouth twice daily Miralax 1 scoop daily. CC: Dr Henderson [place]

## 2010-09-27 ENCOUNTER — Encounter: Payer: Self-pay | Admitting: Internal Medicine

## 2010-09-27 ENCOUNTER — Ambulatory Visit (AMBULATORY_SURGERY_CENTER): Payer: 59 | Admitting: Internal Medicine

## 2010-09-27 DIAGNOSIS — D126 Benign neoplasm of colon, unspecified: Secondary | ICD-10-CM

## 2010-09-27 DIAGNOSIS — K589 Irritable bowel syndrome without diarrhea: Secondary | ICD-10-CM

## 2010-09-27 MED ORDER — SODIUM CHLORIDE 0.9 % IV SOLN: 500.0000 mL | INTRAVENOUS | Status: DC

## 2010-09-27 NOTE — Patient Instructions (Signed)
Green and blue discharge instructions reviewed with patient and care partner.  Impressions/Recommendations:  Polyp (handout given)  Repeat exam 5 years.  Resume medications as you were taking them prior to your procedure.

## 2010-09-28 ENCOUNTER — Telehealth: Payer: Self-pay

## 2010-09-28 NOTE — Telephone Encounter (Signed)

## 2010-10-02 ENCOUNTER — Encounter: Payer: Self-pay | Admitting: Internal Medicine

## 2010-11-09 ENCOUNTER — Other Ambulatory Visit: Payer: Self-pay | Admitting: Internal Medicine

## 2010-11-09 DIAGNOSIS — C73 Malignant neoplasm of thyroid gland: Secondary | ICD-10-CM

## 2010-11-17 ENCOUNTER — Ambulatory Visit
Admission: RE | Admit: 2010-11-17 | Discharge: 2010-11-17 | Disposition: A | Discharge status: Home / Self Care | Payer: 59 | Source: Ambulatory Visit | Attending: Internal Medicine | Admitting: Internal Medicine

## 2010-11-17 DIAGNOSIS — C73 Malignant neoplasm of thyroid gland: Secondary | ICD-10-CM

## 2011-01-12 ENCOUNTER — Encounter: Payer: Self-pay | Admitting: Gynecology

## 2011-01-17 ENCOUNTER — Other Ambulatory Visit: Payer: Self-pay | Admitting: Gynecology

## 2011-01-17 ENCOUNTER — Other Ambulatory Visit: Payer: Self-pay | Admitting: *Deleted

## 2011-01-17 DIAGNOSIS — N6489 Other specified disorders of breast: Secondary | ICD-10-CM

## 2011-08-06 ENCOUNTER — Other Ambulatory Visit: Payer: Self-pay | Admitting: *Deleted

## 2011-08-06 DIAGNOSIS — N951 Menopausal and female climacteric states: Secondary | ICD-10-CM

## 2011-08-06 MED ORDER — ESTRADIOL 0.05 MG/24HR TD PTTW: 1.0000 | MEDICATED_PATCH | TRANSDERMAL | Status: DC

## 2011-08-07 ENCOUNTER — Encounter: Payer: 59 | Admitting: Gynecology

## 2011-08-20 ENCOUNTER — Encounter: Payer: 59 | Admitting: Gynecology

## 2011-08-20 ENCOUNTER — Encounter: Payer: Self-pay | Admitting: Gynecology

## 2011-08-21 ENCOUNTER — Encounter: Payer: Self-pay | Admitting: Gynecology

## 2011-08-21 ENCOUNTER — Ambulatory Visit (INDEPENDENT_AMBULATORY_CARE_PROVIDER_SITE_OTHER): Payer: 59 | Admitting: Gynecology

## 2011-08-21 VITALS — BP 132/80 | Ht 65.0 in | Wt 192.0 lb

## 2011-08-21 DIAGNOSIS — Z79890 Hormone replacement therapy (postmenopausal): Secondary | ICD-10-CM

## 2011-08-21 DIAGNOSIS — Z01419 Encounter for gynecological examination (general) (routine) without abnormal findings: Secondary | ICD-10-CM

## 2011-08-21 DIAGNOSIS — N8111 Cystocele, midline: Secondary | ICD-10-CM

## 2011-08-21 MED ORDER — ESTRADIOL 0.05 MG/24HR TD PTTW: 1.0000 | MEDICATED_PATCH | TRANSDERMAL | Status: DC

## 2011-08-21 NOTE — Progress Notes (Signed)
Yvette Diaz [birth year] [id number]        56 y.o.  G0P0 for annual exam.  Doing well.  Past medical history,surgical history, medications, allergies, family history and social history were all reviewed and documented in the EPIC chart. ROS:  Was performed and pertinent positives and negatives are included in the history.  Exam: Amy assistant Filed Vitals:   08/21/11 1029  BP: 132/80  Height: 5\' 5"  (1.651 m)  Weight: 192 lb (87.091 kg)   General appearance  Normal Skin grossly normal Head/Neck normal with no cervical or supraclavicular adenopathy thyroid normal Lungs  clear Cardiac RR, without RMG Abdominal  soft, nontender, without masses, organomegaly or hernia Breasts  examined lying and sitting without masses, retractions, discharge or axillary adenopathy. Pelvic  Ext/BUS/vagina  normal with mild atrophic changes  Adnexa  Without masses or tenderness    Anus and perineum  normal   Rectovaginal  normal sphincter tone without palpated masses or tenderness.    Assessment/Plan:  56 y.o. G0P0 female for annual exam.   1. Postmenopausal/LAVH endometriosis. Patient on Vivelle 0.05 doing well. She tried to wean herself and noticed significant hot flashes which were unacceptable. I again reviewed the risks of ERT particularly with her history of medical issues to include increased risk of stroke heart attack DVT possible increased risk of breast cancer. The issues of first pass effect also reviewed. ACOG/NAMS statements for lowest dose for shortest period of time reviewed.  Patient understands and accepts the risks and wants to continue. I prescribed miniVivelle 0.05x1 year. 2. History cystocele. [place] [facility] [place] [facility] with good relief of her urinary incontinence. Vagina appears to be well supported without gross evidence of cystocele as in the past. We'll continue to monitor. 3. Pap smear.  Pap 08/2010 normal. Patient is status post  hysterectomy for benign indications without history of significant abnormal Pap smears before. Reviewed current screening guidelines options for stop screening altogether versus less frequent screening reviewed. No Pap done this year and we'll readdress on an annual basis. 4. Mammography. Patient up-to-date with mammogram January 2013. She'll continue with annual mammography. SBE monthly reviewed. 5. DEXA.  DEXA 08/2010 normal. We'll repeat at 5 your interval. Increase calcium vitamin D reviewed. 6. Colonoscopy. Patient had colonoscopy within the past year historically. We'll follow up with their recommended interval. 7. Health maintenance. No lab work was done today as is all done through her primary physician's office. Follow up one year, sooner as needed.    Dara Lords MD, 11:19 AM 08/21/2011

## 2011-08-21 NOTE — Patient Instructions (Addendum)
Follow up in one year for annual gynecologic exam.  Hormone Therapy At menopause, your body begins making less estrogen and progesterone hormones. This causes the body to stop having menstrual periods. This is because estrogen and progesterone hormones control your periods and menstrual cycle. A lack of estrogen may cause symptoms such as:  Hot flushes (or hot flashes).   Vaginal dryness.   Dry skin.   Loss of sex drive.   Risk of bone loss (osteoporosis).  When this happens, you may choose to take hormone therapy to get back the estrogen lost during menopause. When the hormone estrogen is given alone, it is usually referred to as ET (Estrogen Therapy). When the hormone progestin is combined with estrogen, it is generally called HT (Hormone Therapy). This was formerly known as hormone replacement therapy (HRT). Your caregiver can help you make a decision on what will be best for you. The decision to use HT seems to change often as new studies are done. Many studies do not agree on the benefits of hormone replacement therapy. LIKELY BENEFITS OF HT INCLUDE PROTECTION FROM:  Hot Flushes (also called hot flashes) - A hot flush is a sudden feeling of heat that spreads over the face and body. The skin may redden like a blush. It is connected with sweats and sleep disturbance. Women going through menopause may have hot flushes a few times a month or several times per day depending on the woman.   Osteoporosis (bone loss)- Estrogen helps guard against bone loss. After menopause, a woman's bones slowly lose calcium and become weak and brittle. As a result, bones are more likely to break. The hip, wrist, and spine are affected most often. Hormone therapy can help slow bone loss after menopause. Weight bearing exercise and taking calcium with vitamin D also can help prevent bone loss. There are also medications that your caregiver can prescribe that can help prevent osteoporosis.   Vaginal Dryness - Loss  of estrogen causes changes in the vagina. Its lining may become thin and dry. These changes can cause pain and bleeding during sexual intercourse. Dryness can also lead to infections. This can cause burning and itching. (Vaginal estrogen treatment can help relieve pain, itching, and dryness.)   Urinary Tract Infections are more common after menopause because of lack of estrogen. Some women also develop urinary incontinence because of low estrogen levels in the vagina and bladder.   Possible other benefits of estrogen include a positive effect on mood and short-term memory in women.  RISKS AND COMPLICATIONS  Using estrogen alone without progesterone causes the lining of the uterus to grow. This increases the risk of lining of the uterus (endometrial) cancer. Your caregiver should give another hormone called progestin if you have a uterus.   Women who take combined (estrogen and progestin) HT appear to have an increased risk of breast cancer. The risk appears to be small, but increases throughout the time that HT is taken.   Combined therapy also makes the breast tissue slightly denser which makes it harder to read mammograms (breast X-rays).   Combined, estrogen and progesterone therapy can be taken together every day, in which case there may be spotting of blood. HT therapy can be taken cyclically in which case you will have menstrual periods. Cyclically means HT is taken for a set amount of days, then not taken, then this process is repeated.   HT may increase the risk of stroke, heart attack, breast cancer and forming blood clots in   your leg.   Transdermal estrogen (estrogen that is absorbed through the skin with a patch or a cream) may have more positive results with:   Cholesterol.   Blood pressure.   Blood clots.  Having the following conditions may indicate you should not have HT:  Endometrial cancer.   Liver disease.   Breast cancer.   Heart disease.   History of blood clots.    Stroke.  TREATMENT   If you choose to take HT and have a uterus, usually estrogen and progestin are prescribed.   Your caregiver will help you decide the best way to take the medications.   Possible ways to take estrogen include:   Pills.   Patches.   Gels.   Sprays.   Vaginal estrogen cream, rings and tablets.   It is best to take the lowest dose possible that will help your symptoms and take them for the shortest period of time that you can.   Hormone therapy can help relieve some of the problems (symptoms) that affect women at menopause. Before making a decision about HT, talk to your caregiver about what is best for you. Be well informed and comfortable with your decisions.  HOME CARE INSTRUCTIONS   Follow your caregivers advice when taking the medications.   A Pap test is done to screen for cervical cancer.   The first Pap test should be done at age 21.   Between ages 21 and 29, Pap tests are repeated every 2 years.   Beginning at age 30, you are advised to have a Pap test every 3 years as long as your past 3 Pap tests have been normal.   Some women have medical problems that increase the chance of getting cervical cancer. Talk to your caregiver about these problems. It is especially important to talk to your caregiver if a new problem develops soon after your last Pap test. In these cases, your caregiver may recommend more frequent screening and Pap tests.   The above recommendations are the same for women who have or have not gotten the vaccine for HPV (Human Papillomavirus).   If you had a hysterectomy for a problem that was not a cancer or a condition that could lead to cancer, then you no longer need Pap tests. However, even if you no longer need a Pap test, a regular exam is a good idea to make sure no other problems are starting.    If you are between ages 65 and 70, and you have had normal Pap tests going back 10 years, you no longer need Pap tests. However,  even if you no longer need a Pap test, a regular exam is a good idea to make sure no other problems are starting.    If you have had past treatment for cervical cancer or a condition that could lead to cancer, you need Pap tests and screening for cancer for at least 20 years after your treatment.   If Pap tests have been discontinued, risk factors (such as a new sexual partner) need to be re-assessed to determine if screening should be resumed.   Some women may need screenings more often if they are at high risk for cervical cancer.   Get mammograms done as per the advice of your caregiver.  SEEK IMMEDIATE MEDICAL CARE IF:  You develop abnormal vaginal bleeding.   You have pain or swelling in your legs, shortness of breath, or chest pain.   You develop dizziness or headaches.     You have lumps or changes in your breasts or armpits.   You have slurred speech.   You develop weakness or numbness of your arms or legs.   You have pain, burning, or bleeding when urinating.   You develop abdominal pain.  Document Released: 09/16/2002 Document Revised: 12/07/2010 Document Reviewed: 01/04/2010 Exit[employer] Patient Information 2012 [employer]. 

## 2012-01-29 ENCOUNTER — Encounter: Payer: Self-pay | Admitting: Gynecology

## 2012-08-21 ENCOUNTER — Encounter: Payer: 59 | Admitting: Gynecology

## 2012-08-25 ENCOUNTER — Encounter: Payer: Self-pay | Admitting: Gynecology

## 2012-09-04 ENCOUNTER — Ambulatory Visit (INDEPENDENT_AMBULATORY_CARE_PROVIDER_SITE_OTHER): Payer: 59 | Admitting: Gynecology

## 2012-09-04 ENCOUNTER — Encounter: Payer: Self-pay | Admitting: Gynecology

## 2012-09-04 VITALS — BP 120/76 | Ht 66.0 in | Wt 191.0 lb

## 2012-09-04 DIAGNOSIS — Z01419 Encounter for gynecological examination (general) (routine) without abnormal findings: Secondary | ICD-10-CM

## 2012-09-04 DIAGNOSIS — Z79890 Hormone replacement therapy (postmenopausal): Secondary | ICD-10-CM

## 2012-09-04 MED ORDER — ESTRADIOL 0.075 MG/24HR TD PTTW: 1.0000 | MEDICATED_PATCH | TRANSDERMAL | Status: DC

## 2012-09-04 NOTE — Progress Notes (Signed)
Yvette Diaz 04/18/1955 [id number]         57 y.o.  G1P0010 for annual exam.  Several issues noted below.  Past medical history,surgical history, medications, allergies, family history and social history were all reviewed and documented in the EPIC chart.  ROS:  Performed and pertinent positives and negatives are included in the history, assessment and plan .  Exam: Kim assistant Filed Vitals:   09/04/12 1100  BP: 120/76  Height: 5\' 6"  (1.676 m)  Weight: 191 lb (86.637 kg)   General appearance  Normal Skin grossly normal Head/Neck normal with no cervical or supraclavicular adenopathy thyroid normal Lungs  clear Cardiac RR, without RMG Abdominal  soft, nontender, without masses, organomegaly or hernia Breasts  examined lying and sitting without masses, retractions, discharge or axillary adenopathy. Pelvic  Ext/BUS/vagina  normal with mild atrophic changes  Adnexa  Without masses or tenderness    Anus and perineum  normal   Rectovaginal  normal sphincter tone without palpated masses or tenderness.    Assessment/Plan:  57 y.o. G1P0010 female for annual exam, status post LAVH for endometriosis.   1. HRT. Patient on Minivelle 0.05 patches but still having hot flushes and night sweats.  I again reviewed the whole issue of HRT with her to include the WHI study with increased risk of stroke, heart attack, DVT and breast cancer. The ACOG and NAMS statements for lowest dose for the shortest period of time reviewed. Transdermal versus oral first-pass effect benefit discussed. Patient wants to continue. Will increase her to Minivelle 0.075 to see if we can't decrease her symptoms. Refill x1 year. 2. History bladder suspension 2012. Has done well without significant symptoms. 3. Pap smear 2012. No Pap smear done today. History of LAVH in her 30s for endometriosis. No history of abnormal Pap smears. Options to stop screening altogether or less frequent screening intervals reviewed. We'll readdress  on an annual basis. 4. Mammography 01/2012. Will continue with annual mammography. SBE monthly reviewed. 5. DEXA 08/2010 normal. Repeat at 5 year interval. Increase calcium vitamin D reviewed. 6. Colonoscopy 2012. Repeat at their recommended interval. 7. Health maintenance. No blood work done as it is all done through her other physician's offices. Followup one year, sooner as needed.  Note: This document was prepared with digital dictation and possible smart phrase technology. Any transcriptional errors that result from this process are unintentional.   Dara Lords MD, 11:30 AM 09/04/2012  Her

## 2012-09-04 NOTE — Patient Instructions (Signed)
Try the higher estrogen dose patch. Call me if you have any issues with it. Otherwise followup in one year for annual exam.

## 2013-01-12 IMAGING — US US SOFT TISSUE HEAD/NECK
1 series · 14 of 25 positions shown · non-contrast
Comparison: 09/20/2009

CLINICAL DATA: Papillary thyroid carcinoma, status post
thyroidectomy

THYROID ULTRASOUND
TECHNIQUE: Ultrasound examination of the thyroid gland and adjacent
soft tissues was performed.

[Series 1: us soft tissue head/neck · 0.05mm/px · 14 of 27 slices shown]
[im 1/27]
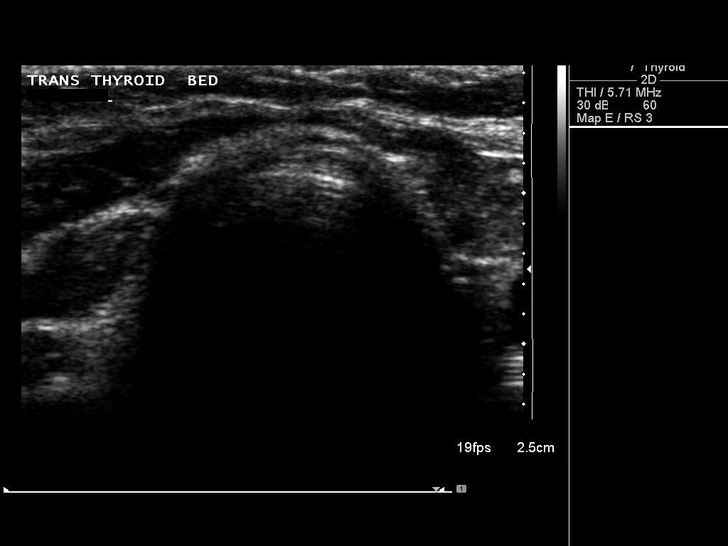
[im 3/27]
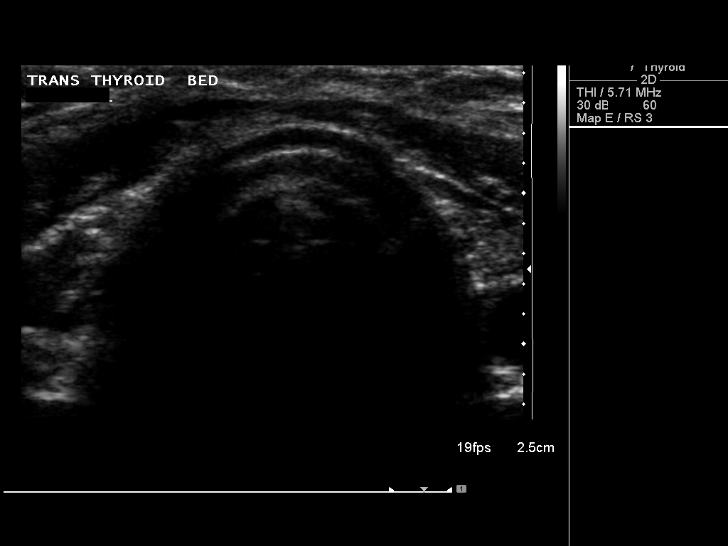
[im 5/27]
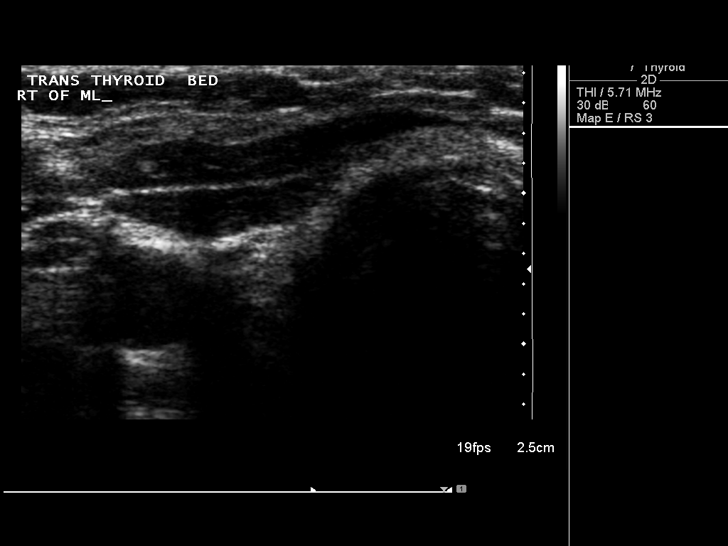
[im 7/27]
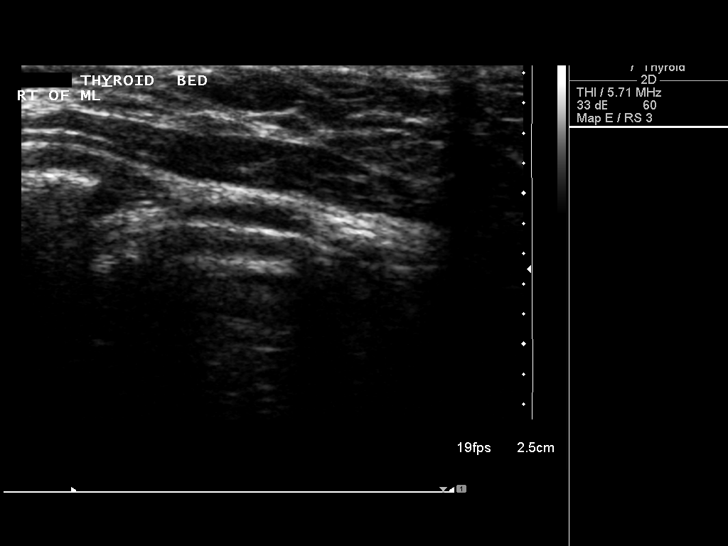
[im 9/27]
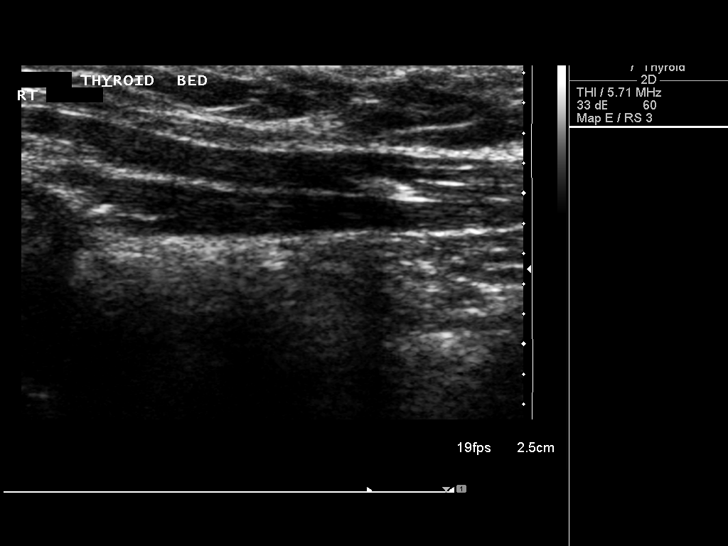
[im 10/27]
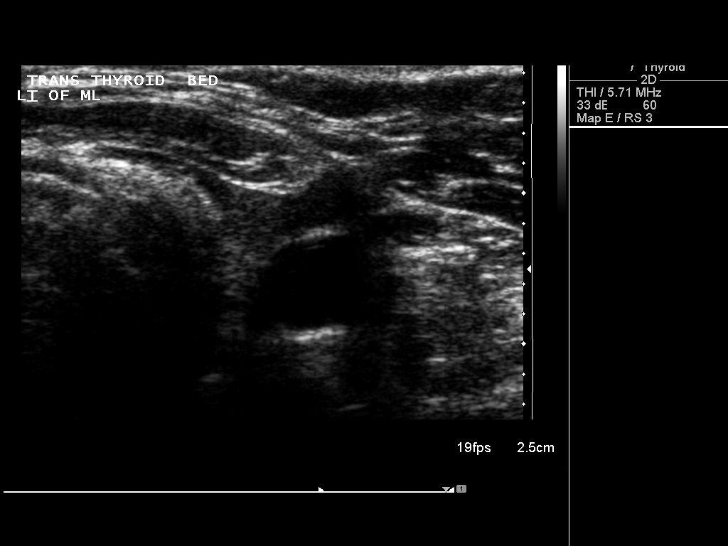
[im 12/27]
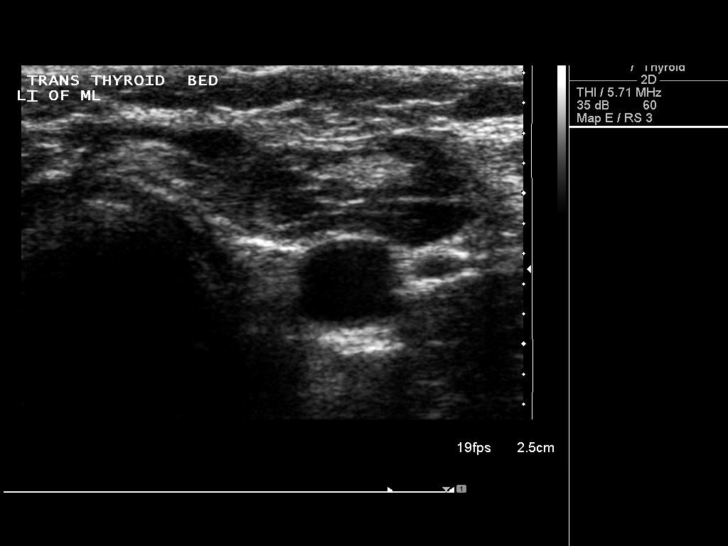
[im 15/27]
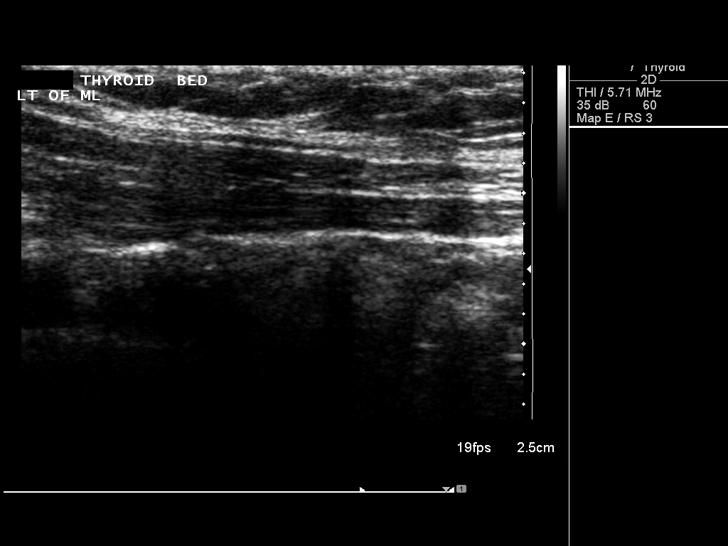
[im 17/27]
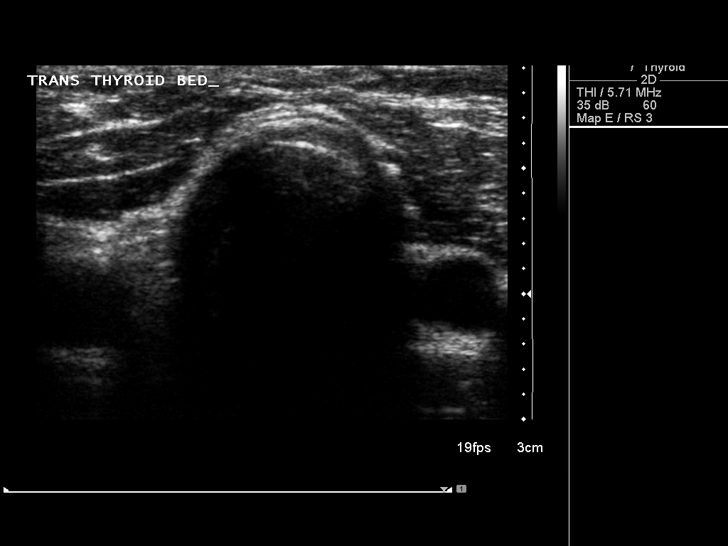
[im 18/27]
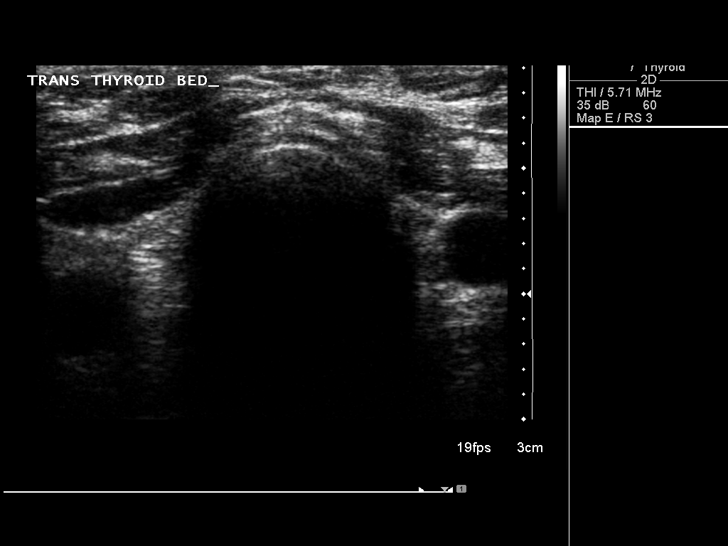
[im 20/27]
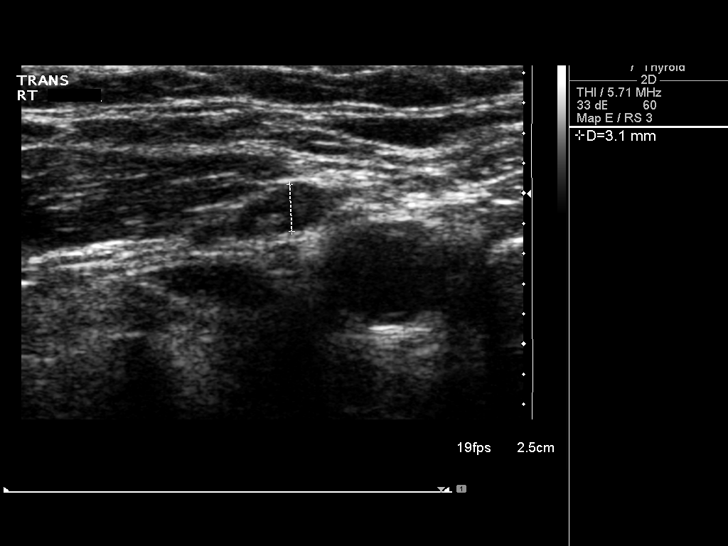
[im 22/27]
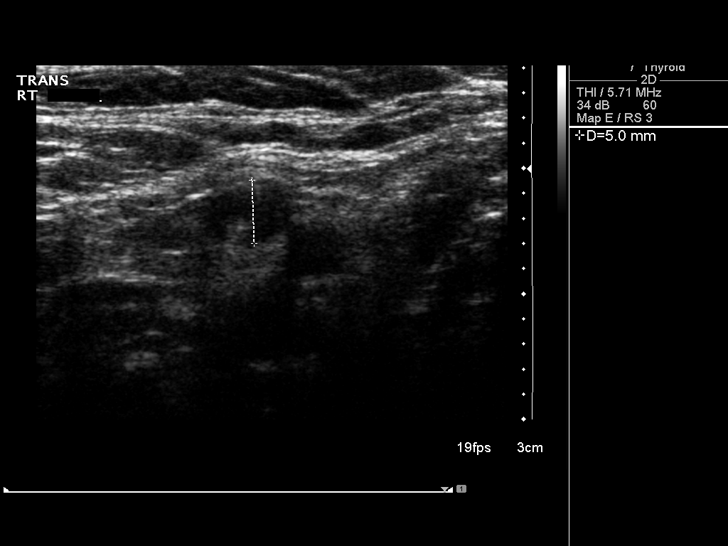
[im 24/27]
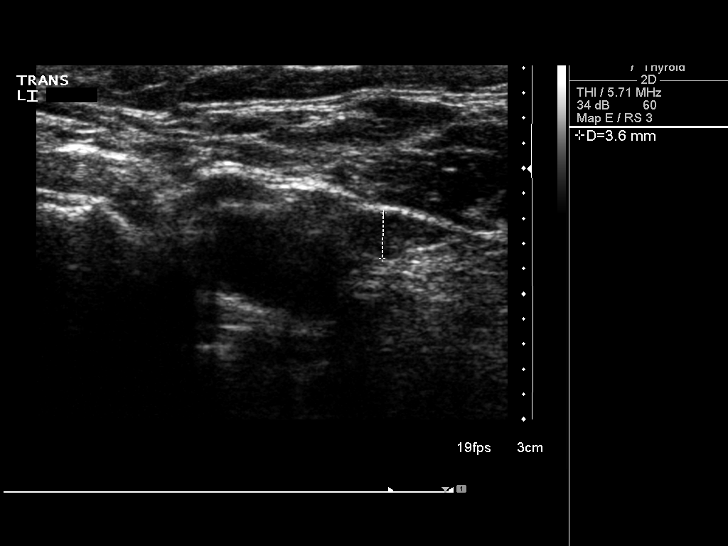
[im 27/27]
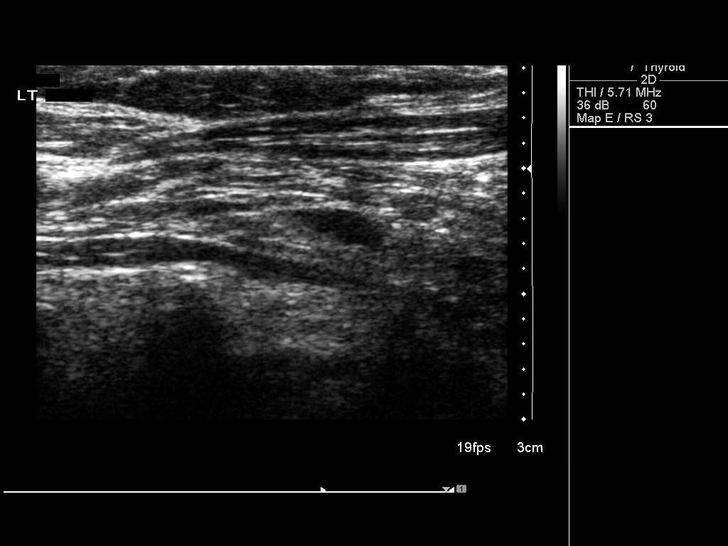

[14 of 25 positions shown; findings below may reference images not displayed]

FINDINGS: Right thyroid lobe:  Surgically absent
Left thyroid lobe:  Surgically absent
Isthmus:  Surgically absent

Focal nodules:  None

Lymphadenopathy:  None visualized. A few small bilateral cervical
nodes are seen, 5 mm or less.
IMPRESSION: 1.  No evidence of residual or recurrent   tissue in the
thyroidectomy bed.
2.  No cervical adenopathy visualized.

## 2013-05-13 ENCOUNTER — Ambulatory Visit: Payer: 59 | Admitting: Podiatry

## 2013-05-27 ENCOUNTER — Ambulatory Visit (INDEPENDENT_AMBULATORY_CARE_PROVIDER_SITE_OTHER): Payer: Medicare Other | Admitting: Podiatry

## 2013-05-27 ENCOUNTER — Encounter: Payer: Self-pay | Admitting: Podiatry

## 2013-05-27 VITALS — BP 137/86 | HR 88 | Resp 12

## 2013-05-27 DIAGNOSIS — Q828 Other specified congenital malformations of skin: Secondary | ICD-10-CM

## 2013-05-27 NOTE — Progress Notes (Signed)
   Subjective:    Patient ID: Yvette Diaz, female    DOB: Jul 28, 1955, 58 y.o.   MRN: [id number]  HPI PT STATED RT FOOT UNDER GREAT TOE HAVE CORN AND ITS BEEN HURTING FOR 1 YEAR. THE CORN IS GETTING BIGGER AND WORSE. THE TOE GET AGGRAVATED BY WALKING OR PUTTING PRESSURE ON IT. TRIED OTC CORN REMOVAL BUT DID NOT HELP.   Review of Systems  Constitutional: Positive for appetite change and fatigue.  Cardiovascular: Positive for palpitations.  Gastrointestinal: Positive for nausea and abdominal pain.  Endocrine: Positive for cold intolerance and heat intolerance.  Musculoskeletal: Positive for back pain, joint swelling and myalgias.  Neurological: Positive for light-headedness.  Hematological: Bruises/bleeds easily.       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 Porokeratoses was debrided and packed with salinocaine.  Reappoint at patient's request

## 2013-05-28 ENCOUNTER — Encounter: Payer: Self-pay | Admitting: Podiatry

## 2013-07-30 ENCOUNTER — Other Ambulatory Visit: Payer: Self-pay | Admitting: Gynecology

## 2013-07-30 DIAGNOSIS — Z780 Asymptomatic menopausal state: Secondary | ICD-10-CM

## 2013-08-25 ENCOUNTER — Ambulatory Visit (INDEPENDENT_AMBULATORY_CARE_PROVIDER_SITE_OTHER): Payer: Medicare Other

## 2013-08-25 DIAGNOSIS — Z780 Asymptomatic menopausal state: Secondary | ICD-10-CM

## 2013-09-10 ENCOUNTER — Encounter: Payer: Medicare Other | Admitting: Gynecology

## 2013-09-18 ENCOUNTER — Other Ambulatory Visit: Payer: Self-pay

## 2013-09-18 MED ORDER — ESTRADIOL 0.075 MG/24HR TD PTTW: 1.0000 | MEDICATED_PATCH | TRANSDERMAL | Status: DC

## 2013-11-02 ENCOUNTER — Encounter: Payer: Self-pay | Admitting: Podiatry

## 2013-11-11 ENCOUNTER — Encounter: Payer: Medicare Other | Admitting: Gynecology

## 2013-11-20 ENCOUNTER — Encounter: Payer: Self-pay | Admitting: Gynecology

## 2013-11-20 ENCOUNTER — Other Ambulatory Visit (HOSPITAL_COMMUNITY)
Admission: RE | Admit: 2013-11-20 | Discharge: 2013-11-20 | Disposition: A | Discharge status: Home / Self Care | Payer: Medicare Other | Source: Ambulatory Visit | Attending: Gynecology | Admitting: Gynecology

## 2013-11-20 ENCOUNTER — Ambulatory Visit (INDEPENDENT_AMBULATORY_CARE_PROVIDER_SITE_OTHER): Payer: Medicare Other | Admitting: Gynecology

## 2013-11-20 VITALS — BP 140/84 | Ht 65.0 in | Wt 185.0 lb

## 2013-11-20 DIAGNOSIS — Z01419 Encounter for gynecological examination (general) (routine) without abnormal findings: Secondary | ICD-10-CM | POA: Diagnosis present

## 2013-11-20 DIAGNOSIS — Z1272 Encounter for screening for malignant neoplasm of vagina: Secondary | ICD-10-CM

## 2013-11-20 DIAGNOSIS — Z79890 Hormone replacement therapy (postmenopausal): Secondary | ICD-10-CM

## 2013-11-20 DIAGNOSIS — N952 Postmenopausal atrophic vaginitis: Secondary | ICD-10-CM

## 2013-11-20 MED ORDER — ESTRADIOL 0.05 MG/24HR TD PTTW: 1.0000 | MEDICATED_PATCH | TRANSDERMAL | Status: DC

## 2013-11-20 NOTE — Patient Instructions (Signed)
You may obtain a copy of any labs that were done today by logging onto MyChart as outlined in the instructions provided with your AVS (after visit summary). The office will not call with normal lab results but certainly if there are any significant abnormalities then we will contact you.   Health Maintenance, Female A healthy lifestyle and preventative care can promote health and wellness.  Maintain regular health, dental, and eye exams.  Eat a healthy diet. Foods like vegetables, fruits, whole grains, low-fat dairy products, and lean protein foods contain the nutrients you need without too many calories. Decrease your intake of foods high in solid fats, added sugars, and salt. Get information about a proper diet from your caregiver, if necessary.  Regular physical exercise is one of the most important things you can do for your health. Most adults should get at least 150 minutes of moderate-intensity exercise (any activity that increases your heart rate and causes you to sweat) each week. In addition, most adults need muscle-strengthening exercises on 2 or more days a week.   Maintain a healthy weight. The body mass index (BMI) is a screening tool to identify possible weight problems. It provides an estimate of body fat based on height and weight. Your caregiver can help determine your BMI, and can help you achieve or maintain a healthy weight. For adults 20 years and older:  A BMI below 18.5 is considered underweight.  A BMI of 18.5 to 24.9 is normal.  A BMI of 25 to 29.9 is considered overweight.  A BMI of 30 and above is considered obese.  Maintain normal blood lipids and cholesterol by exercising and minimizing your intake of saturated fat. Eat a balanced diet with plenty of fruits and vegetables. Blood tests for lipids and cholesterol should begin at age 61 and be repeated every 5 years. If your lipid or cholesterol levels are high, you are over 50, or you are a high risk for heart  disease, you may need your cholesterol levels checked more frequently.Ongoing high lipid and cholesterol levels should be treated with medicines if diet and exercise are not effective.  If you smoke, find out from your caregiver how to quit. If you do not use tobacco, do not start.  Lung cancer screening is recommended for adults aged 33 80 years who are at high risk for developing lung cancer because of a history of smoking. Yearly low-dose computed tomography (CT) is recommended for people who have at least a 30-pack-year history of smoking and are a current smoker or have quit within the past 15 years. A pack year of smoking is smoking an average of 1 pack of cigarettes a day for 1 year (for example: 1 pack a day for 30 years or 2 packs a day for 15 years). Yearly screening should continue until the smoker has stopped smoking for at least 15 years. Yearly screening should also be stopped for people who develop a health problem that would prevent them from having lung cancer treatment.  If you are pregnant, do not drink alcohol. If you are breastfeeding, be very cautious about drinking alcohol. If you are not pregnant and choose to drink alcohol, do not exceed 1 drink per day. One drink is considered to be 12 ounces (355 mL) of beer, 5 ounces (148 mL) of wine, or 1.5 ounces (44 mL) of liquor.  Avoid use of street drugs. Do not share needles with anyone. Ask for help if you need support or instructions about stopping  the use of drugs.  High blood pressure causes heart disease and increases the risk of stroke. Blood pressure should be checked at least every 1 to 2 years. Ongoing high blood pressure should be treated with medicines, if weight loss and exercise are not effective.  If you are 59 to 60 years old, ask your caregiver if you should take aspirin to prevent strokes.  Diabetes screening involves taking a blood sample to check your fasting blood sugar level. This should be done once every 3  years, after age 91, if you are within normal weight and without risk factors for diabetes. Testing should be considered at a younger age or be carried out more frequently if you are overweight and have at least 1 risk factor for diabetes.  Breast cancer screening is essential preventative care for women. You should practice "breast self-awareness." This means understanding the normal appearance and feel of your breasts and may include breast self-examination. Any changes detected, no matter how small, should be reported to a caregiver. Women in their 66s and 30s should have a clinical breast exam (CBE) by a caregiver as part of a regular health exam every 1 to 3 years. After age 101, women should have a CBE every year. Starting at age 100, women should consider having a mammogram (breast X-ray) every year. Women who have a family history of breast cancer should talk to their caregiver about genetic screening. Women at a high risk of breast cancer should talk to their caregiver about having an MRI and a mammogram every year.  Breast cancer gene (BRCA)-related cancer risk assessment is recommended for women who have family members with BRCA-related cancers. BRCA-related cancers include breast, ovarian, tubal, and peritoneal cancers. Having family members with these cancers may be associated with an increased risk for harmful changes (mutations) in the breast cancer genes BRCA1 and BRCA2. Results of the assessment will determine the need for genetic counseling and BRCA1 and BRCA2 testing.  The Pap test is a screening test for cervical cancer. Women should have a Pap test starting at age 57. Between ages 25 and 35, Pap tests should be repeated every 2 years. Beginning at age 37, you should have a Pap test every 3 years as long as the past 3 Pap tests have been normal. If you had a hysterectomy for a problem that was not cancer or a condition that could lead to cancer, then you no longer need Pap tests. If you are  between ages 50 and 76, and you have had normal Pap tests going back 10 years, you no longer need Pap tests. If you have had past treatment for cervical cancer or a condition that could lead to cancer, you need Pap tests and screening for cancer for at least 20 years after your treatment. If Pap tests have been discontinued, risk factors (such as a new sexual partner) need to be reassessed to determine if screening should be resumed. Some women have medical problems that increase the chance of getting cervical cancer. In these cases, your caregiver may recommend more frequent screening and Pap tests.  The human papillomavirus (HPV) test is an additional test that may be used for cervical cancer screening. The HPV test looks for the virus that can cause the cell changes on the cervix. The cells collected during the Pap test can be tested for HPV. The HPV test could be used to screen women aged 44 years and older, and should be used in women of any age  who have unclear Pap test results. After the age of 55, women should have HPV testing at the same frequency as a Pap test.  Colorectal cancer can be detected and often prevented. Most routine colorectal cancer screening begins at the age of 44 and continues through age 20. However, your caregiver may recommend screening at an earlier age if you have risk factors for colon cancer. On a yearly basis, your caregiver may provide home test kits to check for hidden blood in the stool. Use of a small camera at the end of a tube, to directly examine the colon (sigmoidoscopy or colonoscopy), can detect the earliest forms of colorectal cancer. Talk to your caregiver about this at age 86, when routine screening begins. Direct examination of the colon should be repeated every 5 to 10 years through age 13, unless early forms of pre-cancerous polyps or small growths are found.  Hepatitis C blood testing is recommended for all people born from [birth year] through 1967 and any  individual with known risks for hepatitis C.  Practice safe sex. Use condoms and avoid high-risk sexual practices to reduce the spread of sexually transmitted infections (STIs). Sexually active women aged 36 and younger should be checked for Chlamydia, which is a common sexually transmitted infection. Older women with new or multiple partners should also be tested for Chlamydia. Testing for other STIs is recommended if you are sexually active and at increased risk.  Osteoporosis is a disease in which the bones lose minerals and strength with aging. This can result in serious bone fractures. The risk of osteoporosis can be identified using a bone density scan. Women ages 20 and over and women at risk for fractures or osteoporosis should discuss screening with their caregivers. Ask your caregiver whether you should be taking a calcium supplement or vitamin D to reduce the rate of osteoporosis.  Menopause can be associated with physical symptoms and risks. Hormone replacement therapy is available to decrease symptoms and risks. You should talk to your caregiver about whether hormone replacement therapy is right for you.  Use sunscreen. Apply sunscreen liberally and repeatedly throughout the day. You should seek shade when your shadow is shorter than you. Protect yourself by wearing long sleeves, pants, a wide-brimmed hat, and sunglasses year round, whenever you are outdoors.  Notify your caregiver of new moles or changes in moles, especially if there is a change in shape or color. Also notify your caregiver if a mole is larger than the size of a pencil eraser.  Stay current with your immunizations. Document Released: 07/03/2010 Document Revised: 04/14/2012 Document Reviewed: 07/03/2010 [facility] Patient Information 2014 [employer].

## 2013-11-20 NOTE — Addendum Note (Signed)
Addended by: Nelva Nay on: 11/20/2013 08:38 AM   Modules accepted: Orders

## 2013-11-20 NOTE — Progress Notes (Signed)
Yvette Diaz [birth year] [id number]        58 y.o.  G1P0010 for follow up exam.  Several issues noted below.  Past medical history,surgical history, problem list, medications, allergies, family history and social history were all reviewed and documented as reviewed in the EPIC chart.  ROS:  12 system ROS performed with pertinent positives and negatives included in the history, assessment and plan.   Additional significant findings :  none   Exam: Kim Counsellor Vitals:   11/20/13 0756  BP: 140/84  Height: 5\' 5"  (1.651 m)  Weight: 185 lb (83.915 kg)   General appearance:  Normal affect, orientation and appearance. Skin: Grossly normal HEENT: Without gross lesions.  No cervical or supraclavicular adenopathy. Thyroid normal.  Lungs:  Clear without wheezing, rales or rhonchi Cardiac: RR, without RMG Abdominal:  Soft, nontender, without masses, guarding, rebound, organomegaly or hernia Breasts:  Examined lying and sitting without masses, retractions, discharge or axillary adenopathy. Pelvic:  Ext/BUS/vagina with generalized atrophic changes  Adnexa  Without masses or tenderness    Anus and perineum  Normal   Rectovaginal  Normal sphincter tone without palpated masses or tenderness.    Assessment/Plan:  58 y.o. G59P0010 female for follow up exam.   1. Postmenopausal/HRT/atrophic genital changes. Status post LAVH for endometriosis. Patient is on minivelle 0.075. Does not think that is really helping with hot flushes. Mostly she is having sweats and she was diagnosed with hypertrichosis. I again reviewed the issues with ERT to include increased risk of stroke heart attack DVT and breast cancer. Patient wants to go ahead and stop this. I recommended she wean with 0.05 mg patch times several months then cut these in half times several months and then stop it. This imaged as well that she'll stop it. I did refill her for the 0.05 mg patch 1 year in the event she does not tolerate stopping  it.  Not having issues with vaginal dryness. 2. Pap smear 2012. Pap smear done today. Reviewed current screening guidelines. Options to stop screening altogether she is status post LAVH for endometriosis. Will readdress on annual basis. 3. DEXA 2012 normal. Will repeat at 5 year interval. Increased calcium vitamin D reviewed. 4. Mammography 02/2013. Continue with annual mammography. SBE monthly reviewed. 5. Colonoscopy 2012. Repeat at their recommended interval. 6. Health maintenance. No routine lab work done as she reports this done at her primary physician's office. Follow up in one year, sooner as needed.     Anastasio Auerbach MD, 8:15 AM 11/20/2013

## 2013-11-23 LAB — CYTOLOGY - PAP

## 2014-03-22 ENCOUNTER — Encounter: Payer: Self-pay | Admitting: Gynecology

## 2014-11-23 ENCOUNTER — Encounter: Payer: Medicare Other | Admitting: Gynecology

## 2014-11-24 ENCOUNTER — Encounter: Payer: Self-pay | Admitting: Gynecology

## 2014-11-24 ENCOUNTER — Ambulatory Visit (INDEPENDENT_AMBULATORY_CARE_PROVIDER_SITE_OTHER): Payer: Medicare Other | Admitting: Gynecology

## 2014-11-24 VITALS — BP 120/76 | Ht 66.0 in | Wt 181.0 lb

## 2014-11-24 DIAGNOSIS — Z01419 Encounter for gynecological examination (general) (routine) without abnormal findings: Secondary | ICD-10-CM | POA: Diagnosis not present

## 2014-11-24 DIAGNOSIS — N951 Menopausal and female climacteric states: Secondary | ICD-10-CM

## 2014-11-24 DIAGNOSIS — N952 Postmenopausal atrophic vaginitis: Secondary | ICD-10-CM

## 2014-11-24 NOTE — Patient Instructions (Signed)

## 2014-11-24 NOTE — Progress Notes (Signed)
Yvette Diaz 18-Jan-1955 [id number]        59 y.o.  G1P0010  For breast and pelvic exam.   Past medical history,surgical history, problem list, medications, allergies, family history and social history were all reviewed and documented as reviewed in the EPIC chart.  ROS:  Performed with pertinent positives and negatives included in the history, assessment and plan.   Additional significant findings :  none   Exam: Kim Counsellor Vitals:   11/24/14 0852  BP: 120/76  Height: 5\' 6"  (1.676 m)  Weight: 181 lb (82.101 kg)   General appearance:  Normal affect, orientation and appearance. Skin: Grossly normal HEENT: Without gross lesions.  No cervical or supraclavicular adenopathy. Thyroid normal.  Lungs:  Clear without wheezing, rales or rhonchi Cardiac: RR, without RMG Abdominal:  Soft, nontender, without masses, guarding, rebound, organomegaly or hernia Breasts:  Examined lying and sitting without masses, retractions, discharge or axillary adenopathy. Pelvic:  Ext/BUS/vagina with atrophic changes  Adnexa  Without masses or tenderness    Anus and perineum  Normal   Rectovaginal  Normal sphincter tone without palpated masses or tenderness.    Assessment/Plan:  59 y.o. G70P0010 female for breast and pelvic exam.   1. Postmenopausal/atrophic genital changes. Status post LAVH for endometriosis.  Had been on minivelle but weaned herself off. Having some hot flashes but she said triggering this to hypoglycemia that they're dealing with. Does not want to go back on ERT. We'll continue to monitor and follow up if she has any issues. 2. Pap smear 2015. No Pap smear done today. Options to stop screening as she is status post hysterectomy for benign indications versus less frequent screening intervals reviewed. Will readdress on an annual basis. 3. DEXA 2012 normal. Plan repeat at age 65. Increased calcium vitamin D reviewed. 4. Mammography 03/2014. Continue with annual mammography. SBE monthly  reviewed. 5. Colonoscopy 2012. Repeat at their recommended interval. 6. Health maintenance. No routine lab work done as this is done at her primary physician's office. Follow up 1 year, sooner as needed.  Anastasio Auerbach MD, 9:10 AM 11/24/2014

## 2015-04-04 ENCOUNTER — Encounter: Payer: Self-pay | Admitting: Gynecology

## 2015-07-20 ENCOUNTER — Encounter: Payer: Self-pay | Admitting: Internal Medicine

## 2015-07-25 ENCOUNTER — Encounter: Payer: Self-pay | Admitting: Gastroenterology

## 2015-08-01 ENCOUNTER — Encounter: Payer: Self-pay | Admitting: Gastroenterology

## 2015-08-17 ENCOUNTER — Other Ambulatory Visit: Payer: Self-pay | Admitting: Family Medicine

## 2015-08-17 ENCOUNTER — Ambulatory Visit
Admission: RE | Admit: 2015-08-17 | Discharge: 2015-08-17 | Disposition: A | Discharge status: Home / Self Care | Payer: Medicare Other | Source: Ambulatory Visit | Attending: Family Medicine | Admitting: Family Medicine

## 2015-08-17 DIAGNOSIS — R0789 Other chest pain: Secondary | ICD-10-CM

## 2015-08-17 DIAGNOSIS — M25531 Pain in right wrist: Secondary | ICD-10-CM

## 2015-09-23 ENCOUNTER — Ambulatory Visit (AMBULATORY_SURGERY_CENTER): Payer: Self-pay

## 2015-09-23 VITALS — Ht 65.0 in | Wt 172.4 lb

## 2015-09-23 DIAGNOSIS — Z86010 Personal history of colonic polyps: Secondary | ICD-10-CM

## 2015-09-23 MED ORDER — NA SULFATE-K SULFATE-MG SULF 17.5-3.13-1.6 GM/177ML PO SOLN: ORAL | 0 refills | Status: DC

## 2015-09-23 NOTE — Progress Notes (Signed)
Per pt, no allergies to soy or egg products.Pt not taking any weight loss meds or using  O2 at home. 

## 2015-10-03 ENCOUNTER — Ambulatory Visit (AMBULATORY_SURGERY_CENTER): Payer: Medicare Other | Admitting: Gastroenterology

## 2015-10-03 ENCOUNTER — Encounter: Payer: Self-pay | Admitting: Gastroenterology

## 2015-10-03 VITALS — BP 151/70 | HR 61 | Temp 97.7°F | Resp 11 | Ht 65.0 in | Wt 172.0 lb

## 2015-10-03 DIAGNOSIS — Z86010 Personal history of colonic polyps: Secondary | ICD-10-CM | POA: Diagnosis not present

## 2015-10-03 LAB — GLUCOSE, CAPILLARY
GLUCOSE-CAPILLARY: 120 mg/dL — AB (ref 65–99)
Glucose-Capillary: 134 mg/dL — ABNORMAL HIGH (ref 65–99)

## 2015-10-03 MED ORDER — SODIUM CHLORIDE 0.9 % IV SOLN: 500.0000 mL | INTRAVENOUS | Status: AC

## 2015-10-03 NOTE — Progress Notes (Signed)
To PACU, vss patent aw report to rn 

## 2015-10-03 NOTE — Patient Instructions (Signed)
YOU HAD AN ENDOSCOPIC PROCEDURE TODAY AT [facility]:   Refer to the procedure report that was given to you for any specific questions about what was found during the examination.  If the procedure report does not answer your questions, please call your gastroenterologist to clarify.  If you requested that your care partner not be given the details of your procedure findings, then the procedure report has been included in a sealed envelope for you to review at your convenience later.  YOU SHOULD EXPECT: Some feelings of bloating in the abdomen. Passage of more gas than usual.  Walking can help get rid of the air that was put into your GI tract during the procedure and reduce the bloating. If you had a lower endoscopy (such as a colonoscopy or flexible sigmoidoscopy) you may notice spotting of blood in your stool or on the toilet paper. If you underwent a bowel prep for your procedure, you may not have a normal bowel movement for a few days.  Please Note:  You might notice some irritation and congestion in your nose or some drainage.  This is from the oxygen used during your procedure.  There is no need for concern and it should clear up in a day or so.  SYMPTOMS TO REPORT IMMEDIATELY:   Following lower endoscopy (colonoscopy or flexible sigmoidoscopy):  Excessive amounts of blood in the stool  Significant tenderness or worsening of abdominal pains  Swelling of the abdomen that is new, acute  Fever of 100F or higher   For urgent or emergent issues, a gastroenterologist can be reached at any hour by calling [phone].   DIET:  We do recommend a small meal at first, but then you may proceed to your regular diet.  Drink plenty of fluids but you should avoid alcoholic beverages for 24 hours.  ACTIVITY:  You should plan to take it easy for the rest of today and you should NOT DRIVE or use heavy machinery until tomorrow (because of the sedation medicines used during the test).     FOLLOW UP: Our staff will call the number listed on your records the next business day following your procedure to check on you and address any questions or concerns that you may have regarding the information given to you following your procedure. If we do not reach you, we will leave a message.  However, if you are feeling well and you are not experiencing any problems, there is no need to return our call.  We will assume that you have returned to your regular daily activities without incident.  If any biopsies were taken you will be contacted by phone or by letter within the next 1-3 weeks.  Please call us at [phone] if you have not heard about the biopsies in 3 weeks.    SIGNATURES/CONFIDENTIALITY: You and/or your care partner have signed paperwork which will be entered into your electronic medical record.  These signatures attest to the fact that that the information above on your After Visit Summary has been reviewed and is understood.  Full responsibility of the confidentiality of this discharge information lies with you and/or your care-partner.  Diverticulosis-handouts given  Repeat colonoscopy in 5 years 2022.

## 2015-10-03 NOTE — Op Note (Signed)
[facility] Patient Name: Yvette Diaz Procedure Date: 10/03/2015 7:56 AM MRN: [id number] Endoscopist: Yvette Diaz , MD Age: 60 Referring MD:  Date of Birth: 09-Jun-1955 Gender: Female Account #: [id number] Procedure:                Colonoscopy Indications:              High risk colon cancer surveillance: Personal                            history of adenoma (10 mm or greater in size) in                            2012 Medicines:                Monitored Anesthesia Care Procedure:                Pre-Anesthesia Assessment:                           - Prior to the procedure, a History and Physical                            was performed, and patient medications and                            allergies were reviewed. The patient's tolerance of                            previous anesthesia was also reviewed. The risks                            and benefits of the procedure and the sedation                            options and risks were discussed with the patient.                            All questions were answered, and informed consent                            was obtained. Prior Anticoagulants: The patient has                            taken no previous anticoagulant or antiplatelet                            agents. ASA Grade Assessment: III - A patient with                            severe systemic disease. After reviewing the risks                            and benefits, the patient was deemed in  satisfactory condition to undergo the procedure.                           After obtaining informed consent, the colonoscope                            was passed under direct vision. Throughout the                            procedure, the patient's blood pressure, pulse, and                            oxygen saturations were monitored continuously. The                            Model CF-HQ190L [id number]) scope was introduced                    through the anus and advanced to the the cecum,                            identified by appendiceal orifice and ileocecal                            valve. The colonoscopy was performed without                            difficulty. The patient tolerated the procedure                            well. The quality of the bowel preparation was                            good. The ileocecal valve, appendiceal orifice, and                            rectum were photographed. The quality of the bowel                            preparation was evaluated using the BBPS [facility]                            Bowel Preparation Scale) with scores of: Right                            Colon = 2, Transverse Colon = 2 and Left Colon = 2.                            The total BBPS score equals 6. The bowel                            preparation used was SUPREP. Scope In: 7:59:10 AM Scope Out: 8:08:49 AM Scope Withdrawal Time: 0 hours 7 minutes 8 seconds  Total Procedure Duration: 0 hours 9  minutes 39 seconds  Findings:                 The digital rectal exam findings include decreased                            sphincter tone.                           A few small-mouthed diverticula were found in the                            left colon.                           The exam was otherwise without abnormality on                            direct and retroflexion views. Complications:            No immediate complications. Estimated Blood Loss:     Estimated blood loss: none. Impression:               - Decreased sphincter tone found on digital rectal                            exam.                           - Diverticulosis in the left colon.                           - The examination was otherwise normal on direct                            and retroflexion views.                           - No specimens collected. Recommendation:           - Patient has a contact number available for                             emergencies. The signs and symptoms of potential                            delayed complications were discussed with the                            patient. Return to normal activities tomorrow.                            Written discharge instructions were provided to the                            patient.                           - Resume previous diet.                           -  Continue present medications.                           - Repeat colonoscopy in 5 years for surveillance. Henry L. Loletha Carrow, MD 10/03/2015 8:13:32 AM This report has been signed electronically.

## 2015-10-04 ENCOUNTER — Telehealth: Payer: Self-pay | Admitting: *Deleted

## 2015-10-04 NOTE — Telephone Encounter (Signed)
  Follow up Call-  Call back number 10/03/2015  Post procedure Call Back phone  # [phone]  Permission to leave phone message Yes  Some recent data might be hidden     Patient questions:  Do you have a fever, pain , or abdominal swelling? No. Pain Score  0 *  Have you tolerated food without any problems? Yes.    Have you been able to return to your normal activities? Yes.    Do you have any questions about your discharge instructions: Diet   No. Medications  No. Follow up visit  No.  Do you have questions or concerns about your Care? No.  Actions: * If pain score is 4 or above: No action needed, pain <4.

## 2015-11-07 ENCOUNTER — Other Ambulatory Visit: Payer: Self-pay | Admitting: Family Medicine

## 2015-11-07 ENCOUNTER — Ambulatory Visit
Admission: RE | Admit: 2015-11-07 | Discharge: 2015-11-07 | Disposition: A | Discharge status: Home / Self Care | Payer: Medicare Other | Source: Ambulatory Visit | Attending: Family Medicine | Admitting: Family Medicine

## 2015-11-07 DIAGNOSIS — M25812 Other specified joint disorders, left shoulder: Secondary | ICD-10-CM

## 2015-11-30 ENCOUNTER — Ambulatory Visit (INDEPENDENT_AMBULATORY_CARE_PROVIDER_SITE_OTHER): Payer: Medicare Other | Admitting: Gynecology

## 2015-11-30 ENCOUNTER — Encounter: Payer: Self-pay | Admitting: Gynecology

## 2015-11-30 VITALS — BP 126/80 | Ht 65.0 in | Wt 173.0 lb

## 2015-11-30 DIAGNOSIS — N952 Postmenopausal atrophic vaginitis: Secondary | ICD-10-CM

## 2015-11-30 DIAGNOSIS — Z01411 Encounter for gynecological examination (general) (routine) with abnormal findings: Secondary | ICD-10-CM

## 2015-11-30 NOTE — Progress Notes (Signed)
    Yvette Diaz July 21, 1955 [id number]        60 y.o.  G1P0010  for annual exam.   Past medical history,surgical history, problem list, medications, allergies, family history and social history were all reviewed and documented as reviewed in the EPIC chart.  ROS:  Performed with pertinent positives and negatives included in the history, assessment and plan.   Additional significant findings :  None   Exam: Caryn Bee assistant Vitals:   11/30/15 0846  BP: 126/80  Weight: 173 lb (78.5 kg)  Height: 5\' 5"  (1.651 m)   Body mass index is 28.79 kg/m.  General appearance:  Normal affect, orientation and appearance. Skin: Grossly normal HEENT: Without gross lesions.  No cervical or supraclavicular adenopathy. Thyroid normal.  Lungs:  Clear without wheezing, rales or rhonchi Cardiac: RR, without RMG Abdominal:  Soft, nontender, without masses, guarding, rebound, organomegaly or hernia Breasts:  Examined lying and sitting without masses, retractions, discharge or axillary adenopathy. Pelvic:  Ext, BUS, Vagina with atrophic changes  Adnexa without masses or tenderness    Anus and perineum normal   Rectovaginal normal sphincter tone without palpated masses or tenderness.    Assessment/Plan:  61 y.o. G16P0010 female for annual exam. Status post LAVH in the past for endometriosis.  1. Postmenopausal/atrophic genital changes. No significant hot flushes, night sweats or vaginal dryness. Continue to monitor and report any issues 2. Pap smear 2015. No Pap smear done today. Options to stop screening per current screening guidelines based on hysterectomy history reviewed. Will readdress on annual basis. 3. DEXA 2015 normal. Plan repeat DEXA at age 24. 78. Mammography 03/2015. Continue with annual mammography when due. SBE monthly reviewed. 5. Colonoscopy 2017. Repeat at their recommended interval. 6. Health maintenance. No routine lab work done as patient does this elsewhere. Follow up 1 year,  sooner as needed.   Anastasio Auerbach MD, 9:05 AM 11/30/2015

## 2015-11-30 NOTE — Patient Instructions (Signed)

## 2016-07-25 ENCOUNTER — Encounter (HOSPITAL_COMMUNITY): Payer: Self-pay | Admitting: Emergency Medicine

## 2016-07-25 ENCOUNTER — Emergency Department (HOSPITAL_COMMUNITY)
Admission: EM | Admit: 2016-07-25 | Discharge: 2016-07-26 | Disposition: A | Discharge status: Home / Self Care | Payer: Medicare Other | Attending: Emergency Medicine | Admitting: Emergency Medicine

## 2016-07-25 ENCOUNTER — Emergency Department (HOSPITAL_COMMUNITY): Payer: Medicare Other

## 2016-07-25 DIAGNOSIS — E119 Type 2 diabetes mellitus without complications: Secondary | ICD-10-CM | POA: Diagnosis not present

## 2016-07-25 DIAGNOSIS — E039 Hypothyroidism, unspecified: Secondary | ICD-10-CM | POA: Insufficient documentation

## 2016-07-25 DIAGNOSIS — Z7984 Long term (current) use of oral hypoglycemic drugs: Secondary | ICD-10-CM | POA: Diagnosis not present

## 2016-07-25 DIAGNOSIS — I10 Essential (primary) hypertension: Secondary | ICD-10-CM | POA: Insufficient documentation

## 2016-07-25 DIAGNOSIS — R1310 Dysphagia, unspecified: Secondary | ICD-10-CM | POA: Insufficient documentation

## 2016-07-25 DIAGNOSIS — J45909 Unspecified asthma, uncomplicated: Secondary | ICD-10-CM | POA: Diagnosis not present

## 2016-07-25 DIAGNOSIS — Z79899 Other long term (current) drug therapy: Secondary | ICD-10-CM | POA: Insufficient documentation

## 2016-07-25 LAB — CBC WITH DIFFERENTIAL/PLATELET
Basophils Absolute: 0 10*3/uL (ref 0.0–0.1)
Basophils Relative: 0 %
EOS ABS: 0.2 10*3/uL (ref 0.0–0.7)
Eosinophils Relative: 2 %
HEMATOCRIT: 38.1 % (ref 36.0–46.0)
HEMOGLOBIN: 13.1 g/dL (ref 12.0–15.0)
LYMPHS ABS: 3.5 10*3/uL (ref 0.7–4.0)
LYMPHS PCT: 45 %
MCH: 30.5 pg (ref 26.0–34.0)
MCHC: 34.4 g/dL (ref 30.0–36.0)
MCV: 88.6 fL (ref 78.0–100.0)
MONOS PCT: 7 %
Monocytes Absolute: 0.5 10*3/uL (ref 0.1–1.0)
NEUTROS ABS: 3.5 10*3/uL (ref 1.7–7.7)
NEUTROS PCT: 46 %
Platelets: 285 10*3/uL (ref 150–400)
RBC: 4.3 MIL/uL (ref 3.87–5.11)
RDW: 13.1 % (ref 11.5–15.5)
WBC: 7.6 10*3/uL (ref 4.0–10.5)

## 2016-07-25 LAB — BASIC METABOLIC PANEL
Anion gap: 10 (ref 5–15)
BUN: 11 mg/dL (ref 6–20)
CHLORIDE: 102 mmol/L (ref 101–111)
CO2: 24 mmol/L (ref 22–32)
CREATININE: 0.76 mg/dL (ref 0.44–1.00)
Calcium: 8.8 mg/dL — ABNORMAL LOW (ref 8.9–10.3)
GFR calc Af Amer: >60 mL/min (ref >60)
GFR calc non Af Amer: >60 mL/min (ref >60)
Glucose, Bld: 156 mg/dL — ABNORMAL HIGH (ref 65–99)
POTASSIUM: 3.6 mmol/L (ref 3.5–5.1)
SODIUM: 136 mmol/L (ref 135–145)

## 2016-07-25 LAB — PROTIME-INR
INR: 0.97
PROTHROMBIN TIME: 12.9 s (ref 11.4–15.2)

## 2016-07-25 NOTE — ED Triage Notes (Signed)
C/o difficulty swallowing that has gradually gotten worse over the past 2 weeks.  Seen at PCP office on Monday and given acid reflux medicine without any relief.  History of Thyroid Cancer.  Pt is able to swallow saliva and states she eats with difficulty. Denies any other symptoms.

## 2016-07-25 NOTE — ED Notes (Signed)
Dr. Thomasene Lot notified of patient and orders received.

## 2016-07-26 ENCOUNTER — Other Ambulatory Visit: Payer: Self-pay | Admitting: Family Medicine

## 2016-07-26 DIAGNOSIS — R1310 Dysphagia, unspecified: Secondary | ICD-10-CM | POA: Diagnosis not present

## 2016-07-26 MED ORDER — DIPHENHYDRAMINE HCL 25 MG PO CAPS
25.0000 mg | ORAL_CAPSULE | Freq: Once | ORAL | Status: AC
  Administered 2016-07-26: 25 mg via ORAL
  Filled 2016-07-26: qty 1

## 2016-07-26 NOTE — ED Notes (Signed)
Pt given graham crackers and a coke to drink.

## 2016-07-26 NOTE — ED Provider Notes (Signed)
[facility] DEPT Provider Note   CSN: [id number] Arrival date & time: 07/25/16  1925     History   Chief Complaint Chief Complaint  Patient presents with  . difficulty swallowing    HPI Yvette Diaz is a 61 y.o. female.  HPI Yvette Diaz is a 61 y.o. female with hx of asthma, DM, Gilberts syndrome, fibromyalgia, HTN, Thyroid ca, presents to ED with complaint of difficulty swallowing. States has had symptoms over the last two weeks. Reports that symptoms wax and wane. She states that this evening the symptoms have been worse than before. She states that she was at church this evening when Sherlynn Stalls started feeling that her throat was swelling and she was unable to swallow. She states "I had to shake my head to even swallow saliva." She reports she recently went to a family doctor for similar symptoms and was switched to a different antiacid medication. She does not recall the name of it. She states that while in the waiting room, her symptoms improved and now she is back to normal. She did recall eating shrimp about an hour prior to symptoms onset. She states she has been eating fish and seafood more often in the last 2 weeks and is wondering if she could be having mild anaphylaxis to it. She does report history of allergies to seafood in the past but thought that maybe she improved. She did not take any medications prior to coming in. She states that her doctor scheduling her from barium swallow. She does also report a history of thyroid cancer and was wondering if this could be causing her symptoms. She denies any sore throat. Denies any pain in her neck. No fever or chills. No swelling in her lips or ears. No rash. No other complaints.  Past Medical History:  Diagnosis Date  . Antral gastritis 2006  . Asthma   . Chronic back pain   . Chronic fatigue   . Depression   . Diabetes mellitus type II    controlled with diet  . Esophageal stricture   . Fatty liver   . Fibromyalgia   .  GERD (gastroesophageal reflux disease)   . Gilbert's syndrome   . Hyperlipidemia   . Hypertension   . Hypothyroid   . IBS (irritable bowel syndrome)   . Papillary thyroid carcinoma ([place]) 2007   One radiation pill/ no chemo  . Tremor, essential     Patient Active Problem List   Diagnosis Date Noted  . HYPERLIPIDEMIA 12/06/2007  . HYPERTENSION 12/06/2007  . ALLERGIC RHINITIS 11/21/2007  . ASTHMA 11/21/2007  . GERD 11/21/2007    Past Surgical History:  Procedure Laterality Date  . back surgeries     2 lumbar and 1 cervical   . BLADDER SUSPENSION  2012  . CHOLECYSTECTOMY  1982  . LAPAROSCOPY     x 3  . LAVH     30'S  . thyroid removed    . TONSILLECTOMY      OB History    Gravida Para Term Preterm AB Living   1       1 0   SAB TAB Ectopic Multiple Live Births   1               Home Medications    Prior to Admission medications   Medication Sig Start Date End Date Taking? Authorizing Provider  albuterol (PROAIR HFA) 108 (90 BASE) MCG/ACT inhaler Inhale 2 puffs into the lungs every 6 (six) hours  as needed.      [provider]  amphetamine-dextroamphetamine (ADDERALL XR) 30 MG 24 hr capsule Take 30 mg by mouth every morning.    [provider]  Biotin 1 MG CAPS Take by mouth daily.    [provider]  calcium citrate-vitamin D (CITRACAL+D) 315-200 MG-UNIT per tablet Take 1 tablet by mouth 2 (two) times daily.      [provider]  cevimeline (EVOXAC) 30 MG capsule Take 30 mg by mouth 3 (three) times daily.      [provider]  cyclobenzaprine (FLEXERIL) 10 MG tablet Take 10 mg by mouth. One tab as needed for muscle spasm      [provider]  DULoxetine (CYMBALTA) 60 MG capsule Take 60 mg by mouth daily.      [provider]  hyoscyamine (LEVSIN SL) 0.125 MG SL tablet Place 0.125 mg under the tongue every 4 (four) hours as needed.      [provider]  irbesartan (AVAPRO) 300 MG tablet Take 300  mg by mouth at bedtime.      [provider]  Lancets 28G [place]  09/19/10   [provider]  lansoprazole (PREVACID) 30 MG capsule Take 30 mg by mouth daily. To take before a meal     [provider]  levothyroxine (SYNTHROID, LEVOTHROID) 137 MCG tablet Take 137 mcg by mouth daily before breakfast.    [provider]  LORazepam (ATIVAN) 0.5 MG tablet Take 0.5 mg by mouth 2 (two) times daily as needed.      [provider]  metFORMIN (GLUCOPHAGE) 500 MG tablet Take 2 pills in am and one pill in evening 05/20/13   [provider]  montelukast (SINGULAIR) 10 MG tablet Take 10 mg by mouth at bedtime.      [provider]  oxyCODONE-acetaminophen (PERCOCET) 5-325 MG per tablet Take 1 tablet by mouth every 4 (four) hours as needed.      [provider]  PREVIDENT 5000 DRY MOUTH 1.1 % GEL dental gel Brush teeth twice a day with gel. 05/02/13   [provider]  promethazine (PHENERGAN) 25 MG tablet Take 25 mg by mouth as needed for nausea or vomiting.    [provider]    Family History Family History  Problem Relation Age of Onset  . Hypertension Mother   . Osteoporosis Mother   . Diabetes Mother   . Diabetes Father   . Hypertension Father   . Heart disease Father   . Diabetes Sister   . Hypertension Sister   . Breast cancer Sister 67  . Osteoporosis Sister   . Rheum arthritis Sister   . Diabetes Brother   . Hypertension Brother     Social History Social History  Substance Use Topics  . Smoking status: Never Smoker  . Smokeless tobacco: Never Used  . Alcohol use No     Allergies   Advair diskus [fluticasone-salmeterol]; Prednisone; Cardizem [diltiazem hcl]; Iodine; Levsin [hyoscyamine sulfate]; Prinivil [lisinopril]; Simvastatin; Sulfonamide derivatives; and Topamax [topiramate]   Review of Systems Review of Systems  Constitutional: Negative for chills and fever.  HENT: Positive for trouble  swallowing. Negative for sore throat.   Respiratory: Negative for cough, chest tightness and shortness of breath.   Cardiovascular: Negative for chest pain, palpitations and leg swelling.  Gastrointestinal: Negative for abdominal pain, diarrhea, nausea and vomiting.  Genitourinary: Negative for dysuria and flank pain.  Musculoskeletal: Negative for arthralgias, myalgias, neck pain and neck  stiffness.  Skin: Negative for rash.  Neurological: Negative for dizziness, weakness and headaches.  All other systems reviewed and are negative.    Physical Exam Updated Vital Signs BP 140/70 (BP Location: Right Arm)   Pulse 64   Temp 97.8 F (36.6 C) (Oral)   Resp 18   Ht 5\' 5"  (1.651 m)   Wt 78.5 kg (173 lb)   SpO2 98%   BMI 28.79 kg/m   Physical Exam  Constitutional: She appears well-developed and well-nourished. No distress.  HENT:  Head: Normocephalic.  Normal oropharynx. Uvula midline.   Eyes: Conjunctivae are normal.  Neck: Normal range of motion. Neck supple. No tracheal deviation present. No thyromegaly present.  Cardiovascular: Normal rate, regular rhythm and normal heart sounds.   Pulmonary/Chest: Effort normal and breath sounds normal. No respiratory distress. She has no wheezes. She has no rales.  Abdominal: Soft. Bowel sounds are normal. She exhibits no distension. There is no tenderness. There is no rebound.  Musculoskeletal: She exhibits no edema.  Neurological: She is alert.  Skin: Skin is warm and dry.  Psychiatric: She has a normal mood and affect. Her behavior is normal.  Nursing note and vitals reviewed.    ED Treatments / Results  Labs (all labs ordered are listed, but only abnormal results are displayed) Labs Reviewed  BASIC METABOLIC PANEL - Abnormal; Notable for the following:       Result Value   Glucose, Bld 156 (*)    Calcium 8.8 (*)    All other components within normal limits  CBC WITH DIFFERENTIAL/PLATELET  PROTIME-INR    EKG  EKG  Interpretation None       Radiology Dg Neck Soft Tissue  Result Date: 07/25/2016 CLINICAL DATA:  Dysphagia. EXAM: NECK SOFT TISSUES - 1+ VIEW COMPARISON:  None. FINDINGS: There is no evidence of retropharyngeal soft tissue swelling or epiglottic enlargement. The cervical airway is unremarkable and no radio-opaque foreign body identified. IMPRESSION: Negative. Electronically Signed   By: Marijo Conception, M.D.   On: 07/25/2016 20:33    Procedures Procedures (including critical care time)  Medications Ordered in ED Medications  diphenhydrAMINE (BENADRYL) capsule 25 mg (not administered)     Initial Impression / Assessment and Plan / ED Course  I have reviewed the triage vital signs and the nursing notes.  Pertinent labs & imaging results that were available during my care of the patient were reviewed by me and considered in my medical decision making (see chart for details).    Patient with difficulty swallowing few hours ago, improved now. Able to swallow with no difficulty. Will give crackers and water to tasks. Will start on Benadryl. She is allergic to steroids, states anaphylaxis is her allergy. Continue on Zantac. Continue on Benadryl. Question allergic reaction to seafood as the cause of her difficulty swallowing. Since she is able to tolerate orals now, okay to be discharged home from the emergency department with close outpatient follow-up. Instructed to call her doctor tomorrow morning. Labs in x-ray of the neck normal here. Vital signs are normal.   12:39 AM Pt took benadryl, ate crackers, drank water with no difficulty or problems with swallowing. Stable for dc home.   Vitals:   07/25/16 1932 07/25/16 2218 07/25/16 2345  BP: (!) 170/71 (!) 153/68 140/70  Pulse: 78 66 64  Resp: 18 16 18   Temp: 98.1 F (36.7 C) 98.2 F (36.8 C) 97.8 F (36.6 C)  TempSrc: Oral Oral Oral  SpO2: 95% 97% 98%  Weight: 78.5 kg (173 lb)    Height: 5\' 5"  (1.651 m)       Final Clinical  Impressions(s) / ED Diagnoses   Final diagnoses:  Dysphagia, unspecified type    New Prescriptions New Prescriptions   No medications on file     Jeannett Senior, Hershal Coria 07/26/16 [place], April, MD 07/26/16 0144

## 2016-07-26 NOTE — Discharge Instructions (Signed)
Benadryl 25mg  every 6 hrs. Continue zantac. Follow up with family doctor for further evaluation. Return if worsening symptoms

## 2016-07-26 NOTE — ED Notes (Signed)
Pt denies issues with swallowing after drinking coke.

## 2016-07-31 ENCOUNTER — Ambulatory Visit
Admission: RE | Admit: 2016-07-31 | Discharge: 2016-07-31 | Disposition: A | Discharge status: Home / Self Care | Payer: Medicare Other | Source: Ambulatory Visit | Attending: Family Medicine | Admitting: Family Medicine

## 2016-07-31 DIAGNOSIS — R1310 Dysphagia, unspecified: Secondary | ICD-10-CM

## 2016-11-30 ENCOUNTER — Encounter: Payer: Medicare Other | Admitting: Gynecology

## 2018-08-04 ENCOUNTER — Other Ambulatory Visit: Payer: Self-pay

## 2018-08-19 ENCOUNTER — Other Ambulatory Visit: Payer: Self-pay

## 2018-08-19 ENCOUNTER — Ambulatory Visit (INDEPENDENT_AMBULATORY_CARE_PROVIDER_SITE_OTHER): Payer: Medicare Other | Admitting: Gynecology

## 2018-08-19 ENCOUNTER — Encounter: Payer: Self-pay | Admitting: Gynecology

## 2018-08-19 VITALS — BP 118/76 | Ht 65.5 in | Wt 178.0 lb

## 2018-08-19 DIAGNOSIS — Z1272 Encounter for screening for malignant neoplasm of vagina: Secondary | ICD-10-CM

## 2018-08-19 DIAGNOSIS — Z01419 Encounter for gynecological examination (general) (routine) without abnormal findings: Secondary | ICD-10-CM | POA: Diagnosis not present

## 2018-08-19 DIAGNOSIS — N952 Postmenopausal atrophic vaginitis: Secondary | ICD-10-CM

## 2018-08-19 NOTE — Progress Notes (Signed)
    Yvette Diaz 1955-04-14 [id number]        63 y.o.  G1P0010 for breast and pelvic exam.  Without gynecologic complaints  Past medical history,surgical history, problem list, medications, allergies, family history and social history were all reviewed and documented as reviewed in the EPIC chart.  ROS:  Performed with pertinent positives and negatives included in the history, assessment and plan.   Additional significant findings : None   Exam: Caryn Bee assistant Vitals:   08/19/18 1533  BP: 118/76  Weight: 178 lb (80.7 kg)  Height: 5' 5.5" (1.664 m)   Body mass index is 29.17 kg/m.  General appearance:  Normal affect, orientation and appearance. Skin: Grossly normal HEENT: Without gross lesions.  No cervical or supraclavicular adenopathy. Thyroid normal.  Lungs:  Clear without wheezing, rales or rhonchi Cardiac: RR, without RMG Abdominal:  Soft, nontender, without masses, guarding, rebound, organomegaly or hernia Breasts:  Examined lying and sitting without masses, retractions, discharge or axillary adenopathy. Pelvic:  Ext, BUS, Vagina: With atrophic changes.  Pap smear of vaginal cuff done  Adnexa: Without masses or tenderness    Anus and perineum: Normal   Rectovaginal: Normal sphincter tone without palpated masses or tenderness.    Assessment/Plan:  63 y.o. G34P0010 female for breast and pelvic exam  1. Postmenopausal.  No significant menopausal symptoms.  Status post LAVH in the past for endometriosis. 2. Pap smear 2015.  Pap smear done today.  No history of significant abnormal Pap smears.  Options to stop screening per current screening guidelines reviewed.  Will readdress on annual basis. 3. Mammography 07/2018.  Continue with annual mammography next year.  Breast exam normal today. 4. Colonoscopy 2017.  Repeat at their recommended interval. 5. DEXA 2015 normal.  Recommend follow-up DEXA now at 5-year interval as she is also turning 63.  She will schedule and  follow-up for this. 6. Health maintenance.  No routine lab work done as patient does this elsewhere.  Follow-up 1 year, sooner as needed.   Anastasio Auerbach MD, 4:00 PM 08/19/2018

## 2018-08-19 NOTE — Patient Instructions (Signed)
Followup for bone density as scheduled. 

## 2018-08-19 NOTE — Addendum Note (Signed)
Addended by: Nelva Nay on: 08/19/2018 04:07 PM   Modules accepted: Orders

## 2018-08-20 LAB — PAP IG W/ RFLX HPV ASCU

## 2018-10-08 ENCOUNTER — Encounter: Payer: Self-pay | Admitting: Gynecology

## 2018-11-05 ENCOUNTER — Other Ambulatory Visit: Payer: Self-pay | Admitting: Gynecology

## 2018-11-05 ENCOUNTER — Encounter: Payer: Self-pay | Admitting: Gynecology

## 2018-11-05 ENCOUNTER — Other Ambulatory Visit: Payer: Self-pay

## 2018-11-05 ENCOUNTER — Ambulatory Visit (INDEPENDENT_AMBULATORY_CARE_PROVIDER_SITE_OTHER): Payer: Medicare Other

## 2018-11-05 DIAGNOSIS — Z780 Asymptomatic menopausal state: Secondary | ICD-10-CM

## 2018-11-05 DIAGNOSIS — Z01419 Encounter for gynecological examination (general) (routine) without abnormal findings: Secondary | ICD-10-CM

## 2019-01-14 ENCOUNTER — Ambulatory Visit (INDEPENDENT_AMBULATORY_CARE_PROVIDER_SITE_OTHER): Payer: Medicare Other | Admitting: Gastroenterology

## 2019-01-14 ENCOUNTER — Encounter: Payer: Self-pay | Admitting: Gastroenterology

## 2019-01-14 VITALS — BP 162/80 | HR 68 | Temp 97.8°F | Ht 65.0 in | Wt 179.0 lb

## 2019-01-14 DIAGNOSIS — M5442 Lumbago with sciatica, left side: Secondary | ICD-10-CM | POA: Diagnosis not present

## 2019-01-14 DIAGNOSIS — R933 Abnormal findings on diagnostic imaging of other parts of digestive tract: Secondary | ICD-10-CM

## 2019-01-14 NOTE — Patient Instructions (Signed)
If you are age 66 or older, your body mass index should be between 23-30. Your Body mass index is 29.79 kg/m. If this is out of the aforementioned range listed, please consider follow up with your Primary Care Provider.  If you are age 31 or younger, your body mass index should be between 19-25. Your Body mass index is 29.79 kg/m. If this is out of the aformentioned range listed, please consider follow up with your Primary Care Provider.   Please contact your family doctor to schedule an appointment for evaluation of Sciatica pain.  It was a pleasure to see you today!  Dr. Loletha Carrow

## 2019-01-14 NOTE — Progress Notes (Signed)
[facility] Consult Note:  History: Yvette Diaz 01/14/2019  Referring provider: Shirline Frees, MD [facility] internal medicine)  Reason for consult/chief complaint: Intestinal Blockage (Severe pain in hip and groin x 3 weeks, 2 ER visits, patient unable to sleep due to pain)   Subjective  HPI: Surveillance colonoscopy with me October 2017, no polyps found.  Patient had 12 mm adenoma in September 2012.  Patient with recent emergency department visits in [place] for flank and abdominal pain.  She had some discharge patient information suggesting UTI and constipation, but no records from those ED visits. A visit summary from 12/25/2018 ED visit was accessed through epic.(test results, no provider note)  Yvette Diaz reports that 2 days before Christmas she had severe lower back pain more toward the left side, radiating down the side and back of her left leg.  It has been severe enough it keeps her from sleeping at night.  She was not having abdominal pain, and was having regular bowel movements throughout all this time.  She had told this to the ED provider, but they apparently still felt she may have some element of constipation based on x-ray showing retained stool.  She took a bowel preparation afterwards but no improvement in this lower back pain.  She contacted primary care after returning home after the holiday and was referred over to [place] because the ED report suggested patient might of constipation and "intestinal blockage".  The back pain is worse with activity and laying down, she has no bowel or bladder incontinence.  ROS:  Review of Systems  Constitutional: Negative for appetite change and unexpected weight change.  HENT: Negative for mouth sores and voice change.   Eyes: Negative for pain and redness.  Respiratory: Negative for cough and shortness of breath.   Cardiovascular: Negative for chest pain and palpitations.  Genitourinary:  Negative for dysuria and hematuria.  Musculoskeletal: Negative for arthralgias and myalgias.  Skin: Negative for pallor and rash.  Neurological: Negative for weakness and headaches.  Hematological: Negative for adenopathy.     Past Medical History: Past Medical History:  Diagnosis Date  . Antral gastritis 2006  . Anxiety   . Asthma   . Chronic back pain   . Chronic fatigue   . Chronic pain syndrome   . Depression   . Diabetes mellitus type II    controlled with diet  . Esophageal stricture   . Fatty liver   . Fibromyalgia   . GERD (gastroesophageal reflux disease)   . Gilbert's syndrome   . Hyperhidrosis   . Hyperlipidemia   . Hypertension   . Hypothyroid   . IBS (irritable bowel syndrome)   . Memory loss   . Papillary thyroid carcinoma ([place]) 2007   One radiation pill/ no chemo  . Sicca syndrome, unspecified ([place])   . Tremor, essential      Past Surgical History: Past Surgical History:  Procedure Laterality Date  . back surgeries     2 lumbar and 1 cervical   . BLADDER SUSPENSION  2012  . CHOLECYSTECTOMY  1982  . LAPAROSCOPY     x 3  . LAVH     30'S  . thyroid removed    . TONSILLECTOMY       Family History: Family History  Problem Relation Age of Onset  . Hypertension Mother   . Osteoporosis Mother   . Diabetes Mother   . Heart failure Mother   . Diabetes Father   .  Hypertension Father   . Heart disease Father   . Diabetes Sister   . Hypertension Sister   . Breast cancer Sister 78  . Osteoporosis Sister   . Rheum arthritis Sister   . Diabetes Brother   . Hypertension Brother     Social History: Social History   Socioeconomic History  . Marital status: Married    Spouse name: Not on file  . Number of children: 0  . Years of education: Not on file  . Highest education level: Not on file  Occupational History  . Occupation: disablility  Tobacco Use  . Smoking status: Never Smoker  . Smokeless tobacco: Never Used  Substance and  Sexual Activity  . Alcohol use: No    Alcohol/week: 0.0 standard drinks  . Drug use: No  . Sexual activity: Never    Comment: HYST-1st intercourse 22 yo-1 partner  Other Topics Concern  . Not on file  Social History Narrative  . Not on file   Social Determinants of Health   Financial Resource Strain:   . Difficulty of Paying Living Expenses: Not on file  Food Insecurity:   . Worried About Charity fundraiser in the Last Year: Not on file  . Ran Out of Food in the Last Year: Not on file  Transportation Needs:   . Lack of Transportation (Medical): Not on file  . Lack of Transportation (Non-Medical): Not on file  Physical Activity:   . Days of Exercise per Week: Not on file  . Minutes of Exercise per Session: Not on file  Stress:   . Feeling of Stress : Not on file  Social Connections:   . Frequency of Communication with Friends and Family: Not on file  . Frequency of Social Gatherings with Friends and Family: Not on file  . Attends Religious Services: Not on file  . Active Member of Clubs or Organizations: Not on file  . Attends Archivist Meetings: Not on file  . Marital Status: Not on file    Allergies: Allergies  Allergen Reactions  . Advair Diskus [Fluticasone-Salmeterol] Swelling  . Prednisone Anaphylaxis  . Cardizem [Diltiazem Hcl]     edema  . Iodine     Fish and shrimp/ causes rash  . Levsin [Hyoscyamine Sulfate]     HA  . Prinivil [Lisinopril]     cough  . Simvastatin     myalgia  . Sulfonamide Derivatives   . Topamax [Topiramate]     Unsure of reaction    Outpatient Meds: Current Outpatient Medications  Medication Sig Dispense Refill  . albuterol (PROAIR HFA) 108 (90 BASE) MCG/ACT inhaler Inhale 2 puffs into the lungs every 6 (six) hours as needed for wheezing.     Marland Kitchen amphetamine-dextroamphetamine (ADDERALL) 30 MG tablet Take 30 mg by mouth 2 (two) times daily.    . bisoprolol (ZEBETA) 5 MG tablet Take 0.05 tablets by mouth daily.    .  cevimeline (EVOXAC) 30 MG capsule Take 30 mg by mouth 3 (three) times daily.      . DULoxetine (CYMBALTA) 60 MG capsule Take 60 mg by mouth daily.      . irbesartan (AVAPRO) 300 MG tablet Take 300 mg by mouth at bedtime.      . Lancets 28G MISC     . levothyroxine (SYNTHROID, LEVOTHROID) 137 MCG tablet Take 137 mcg by mouth daily before breakfast.    . LORazepam (ATIVAN) 0.5 MG tablet Take 0.5 mg by mouth 2 (two) times daily  as needed for anxiety.     . metFORMIN (GLUCOPHAGE) 500 MG tablet Take 500-1,000 mg by mouth See admin instructions. Take 2 tablets every morning and take 1 tablet every evening    . montelukast (SINGULAIR) 10 MG tablet Take 10 mg by mouth daily.     Marland Kitchen oxybutynin (DITROPAN) 5 MG tablet Take 1 tablet by mouth 2 (two) times daily as needed.    . pantoprazole (PROTONIX) 40 MG tablet Take 40 mg by mouth daily.     Current Facility-Administered Medications  Medication Dose Route Frequency Provider Last Rate Last Admin  . 0.9 %  sodium chloride infusion  500 mL Intravenous Continuous Nelida Meuse III, MD          ___________________________________________________________________ Objective   Exam:  BP (!) 162/80   Pulse 68   Temp 97.8 F (36.6 C)   Ht _0  (1.651 m)   Wt 179 lb (81.2 kg)   BMI 29.79 kg/m    General: Antalgic gait, no acute distress, nontoxic, pleasant and conversational  Eyes: sclera anicteric, no redness  ENT: oral mucosa moist without lesions, no cervical or supraclavicular lymphadenopathy  CV: RRR without murmur, S1/S2, no JVD, no peripheral edema  Resp: clear to auscultation bilaterally, normal RR and effort noted  GI: soft, no tenderness, with active bowel sounds. No guarding or palpable organomegaly noted.  Skin; warm and dry, no rash or jaundice noted  Neuro: awake, alert and oriented x 3. Normal gross motor function and fluent speech Pain does not seem changed with straight leg raise No CVA or low back tenderness Labs:  On  12/25/2018: Pharmacy Estimated Creatinine Clearance 60.01 mL/min  (12/25/18 7:05 PM)  60.01 mL/min  (12/25/18 5:02 PM)   RBC [4.20-5.40 x10^6/mcL] 3.85 x10^6/mcL  *LOW* (12/25/18 4:39 PM)     Neutrophil % [40.0-64.0 %] 41.9 %  (12/25/18 4:39 PM)     Lymphocyte % [22.0-44.0 %] 46.4 %  *HI* (12/25/18 4:39 PM)     Monocyte % [4.0-14.0 %] 8.3 %  (12/25/18 4:39 PM)     Basophil % [0.0-2.0 %] 0.9 %  (12/25/18 4:39 PM)     BUN [7-17 mg/dL] 15 mg/dL  (12/25/18 4:39 PM)     Glucose Level [65-105 mg/dL] 125 mg/dL  *HI* (12/25/18 4:39 PM)     Potassium Level [3.5-5.0 mmol/L] 4.1 mmol/L  (12/25/18 4:39 PM)     Basophil Absolute [0.0-0.2 x10^3/mcL] 0.0 x10^3/mcL  (12/25/18 4:39 PM)     MCV [80-95 fL] 90 fL  (12/25/18 4:39 PM)     AST [14-36 unit/L] 28 unit/L  (12/25/18 4:39 PM)     ALT [0-34 unit/L] 18 unit/L  (12/25/18 4:39 PM)     MCHC [32.0-36.0 g/dL] 32.8 g/dL  (12/25/18 4:39 PM)     Sodium Level [135-145 mmol/L] 136 mmol/L  (12/25/18 4:39 PM)     Lymphocyte Absolute [1.1-4.8 x10^3/mcL] 2.6 x10^3/mcL  (12/25/18 4:39 PM)     Hematocrit [35.0-47.0 %] 34.8 %  *LOW* (12/25/18 4:39 PM)     Calcium Level [8.4-10.2 mg/dL] 8.6 mg/dL  (12/25/18 4:39 PM)     Monocyte Absolute [0.2-1.5 x10^3/mcL] 0.5 x10^3/mcL  (12/25/18 4:39 PM)     Albumin Level [3.5-5.0 g/dL] 3.6 g/dL  (12/25/18 4:39 PM)     Protein Total [6.3-8.2 g/dL] 6.5 g/dL  (12/25/18 4:39 PM)     MCH [27.0-32.0 pg] 29.6 pg  (12/25/18 4:39 PM)     Neutrophil Absolute [1.9-7.6 x10^3/mcL] 2.3 x10^3/mcL  (12/25/18 4:39 PM)  Bilirubin, Total [0.1-1.2 mg/dL] 0.6 mg/dL  (12/25/18 4:39 PM)     Hgb [12.0-16.0 g/dL] 11.4 g/dL  *LOW* (12/25/18 4:39 PM)     Alkaline Phosphatase [40-180 unit/L] 63 unit/L  (12/25/18 4:39 PM)     WBC Count [4.80-10.80 x10^3/mcL] 5.56 x10^3/mcL  (12/25/18 4:39 PM)     Platelets [150-450 x10^3/mcL] 230 x10^3/mcL  (12/25/18 4:39 PM)     Eosinophil Absolute [0.0-0.5 x10^3/mcL] 0.1 x10^3/mcL    (12/25/18 4:39 PM)     RDW [11.5-14.5 %] 12.8 %  (12/25/18 4:39 PM)     eGFR Non-African American 86 mL/min/1.73 m2  *NA* (12/25/18 4:39 PM)     eGFR African American 100 mL/min/1.73 m2  *NA* (12/25/18 4:39 PM)     Chloride Level [98-109 mmol/L] 102 mmol/L  (12/25/18 4:39 PM)     Immature Granulocyte % [0.1-0.7 %] 0.2 %  (12/25/18 4:39 PM)     NRBC % [<=0.0 %] 0.0 %  (12/25/18 4:39 PM)     Immature Granulocyte Absolute [0.0-0.1 x10^3/mcL] 0.0 x10^3/mcL  (12/25/18 4:39 PM)     NRBC Absolute [<=0.0 x10^3/mcL] 0.0 x10^3/mcL  (12/25/18 4:39 PM)     Calculated Osmolality [261-280 mOsm/kg] 284 mOsm/kg  *HI* (12/25/18 4:39 PM)     BUN/Creatinine Ratio 20.6 ratio  *NA* (12/25/18 4:39 PM)     CO2 Level [24-29 mmol/L] 28 mmol/L  (12/25/18 4:39 PM)     Creatinine Level [0.52-1.04 mg/dL] 0.73 mg/dL  (12/25/18 4:39 PM)     Anion Gap [3-11] 6  (12/25/18 4:39 PM)     Eosinophil % [0.0-5.0 %] 2.3 %  (12/25/18 4:39 PM)     Lactic Acid Level [0.7-1.9 mmol/L] 1.1 mmol/L  (12/25/18 4:39 PM)     Radiology Reports       Exam Date Time Procedure Performing Provider Status  12/25/18 5:51 PM US Kidney Bilateral/Bladder Salvadore Farber (Verified)  Notes:   (US Kidney Bilateral/Bladder) Reason For Exam: left flank pain, hematuria, neg CT   REPORT EXAM: ULTRASOUND RENAL BILATERAL  CLINICAL DATA: left flank pain, hematuria, neg CT; left flank pain x 3 weeks  Acute left flank pain and hematuria. Three-view left thumb pain.  COMPARISON: CT from today.  FINDINGS: KIDNEYS: Left renal atrophy with multifocal cortical scarring better seen on CT. Punctate nonobstructing right inferior renal stone better seen on CT. Renal echogenicity is within normal limits for age. No hydronephrosis. No discernible obstructing renal calculi. No solid masses.  BLADDER: Within normal limits for the degree of distention.  OTHER: Bilateral ureteral jets visualized.  IMPRESSION: No change from  CT today.  SIGNATURE:  Electronically Signed By: Alesia Richards M.D. On: 12/25/2018 17:55 Dictated by: Domingo Cocking, MD, Nyoka Lint Dictated DT/TM: 12/25/2018 5:54 pm  Signed by: Domingo Cocking, MD, Nyoka Lint Signed (Electronic Signature): 12/25/2018 5:56 pm    Exam Date Time Procedure Performing Provider Status  12/25/18 4:47 PM XR Abdomen KUB 1 View [place], [place];  Auth (Verified)  Notes:   (XR Abdomen KUB 1 View) Reason For Exam: flank pain, constipation   REPORT EXAM: ABDOMEN - 1 VIEW  CLINICAL DATA: Acute left flank pain, constipation. Pain worsening over past 2 days, current urinary tract infection.  COMPARISON: Abdominal and pelvic CT, 12/25/2018.  FINDINGS: Supine AP view of the abdomen including the pelvis demonstrate a mild to moderate constipation without evidence of bowel obstruction. Cholecystectomy clips are evident. Subtle nonobstructing right nephrolithiasis is present. Lumbosacral spine junction fusion hardware is noted. Small pelvic calcifications are statistically vascular.  IMPRESSION: 1. Mild to moderate constipation. 2. Subtle nonobstructing right nephrolithiasis.  SIGNATURE:  Electronically Signed By: Durward Mallard M.D. On: 12/25/2018 16:53 Dictated by: Duffy Bruce, MD, Marshall Cork Dictated DT/TM: 12/25/2018 4:52 pm  BUN 15   Assessment: Encounter Diagnoses  Name Primary?  . Acute left-sided low back pain with left-sided sciatica Yes  . Abnormal finding on GI tract imaging     X-ray apparently suggested constipation, but patient clearly states she was having regular bowel movements without difficulty and pain was not improved after a bowel preparation.  Her symptoms are most consistent with sciatica and not a primary digestive condition.  I recommended she contact primary care today for evaluation and treatment.   Thank you for the courtesy of this consult.  Please call me with any questions or concerns.  Nelida Meuse III  CC: Referring provider  noted above

## 2019-05-08 ENCOUNTER — Other Ambulatory Visit: Payer: Self-pay

## 2019-05-08 ENCOUNTER — Emergency Department (HOSPITAL_COMMUNITY)
Admission: EM | Admit: 2019-05-08 | Discharge: 2019-05-08 | Disposition: A | Discharge status: Home / Self Care | Payer: Medicare Other | Attending: Emergency Medicine | Admitting: Emergency Medicine

## 2019-05-08 ENCOUNTER — Emergency Department (HOSPITAL_COMMUNITY): Payer: Medicare Other

## 2019-05-08 ENCOUNTER — Encounter (HOSPITAL_COMMUNITY): Payer: Self-pay | Admitting: Emergency Medicine

## 2019-05-08 DIAGNOSIS — I10 Essential (primary) hypertension: Secondary | ICD-10-CM | POA: Diagnosis not present

## 2019-05-08 DIAGNOSIS — R110 Nausea: Secondary | ICD-10-CM | POA: Diagnosis not present

## 2019-05-08 DIAGNOSIS — R05 Cough: Secondary | ICD-10-CM | POA: Insufficient documentation

## 2019-05-08 DIAGNOSIS — R5383 Other fatigue: Secondary | ICD-10-CM | POA: Diagnosis not present

## 2019-05-08 DIAGNOSIS — R0602 Shortness of breath: Secondary | ICD-10-CM

## 2019-05-08 DIAGNOSIS — E119 Type 2 diabetes mellitus without complications: Secondary | ICD-10-CM | POA: Insufficient documentation

## 2019-05-08 DIAGNOSIS — Z79899 Other long term (current) drug therapy: Secondary | ICD-10-CM | POA: Diagnosis not present

## 2019-05-08 DIAGNOSIS — E039 Hypothyroidism, unspecified: Secondary | ICD-10-CM | POA: Diagnosis not present

## 2019-05-08 DIAGNOSIS — U071 COVID-19: Secondary | ICD-10-CM | POA: Diagnosis not present

## 2019-05-08 LAB — URINALYSIS, ROUTINE W REFLEX MICROSCOPIC
Bilirubin Urine: NEGATIVE
Glucose, UA: NEGATIVE mg/dL
Hgb urine dipstick: NEGATIVE
Ketones, ur: NEGATIVE mg/dL
Leukocytes,Ua: NEGATIVE
Nitrite: NEGATIVE
Protein, ur: NEGATIVE mg/dL
Specific Gravity, Urine: 1.01 (ref 1.005–1.030)
pH: 5 (ref 5.0–8.0)

## 2019-05-08 LAB — BASIC METABOLIC PANEL
Anion gap: 12 (ref 5–15)
BUN: 16 mg/dL (ref 8–23)
CO2: 23 mmol/L (ref 22–32)
Calcium: 8.7 mg/dL — ABNORMAL LOW (ref 8.9–10.3)
Chloride: 102 mmol/L (ref 98–111)
Creatinine, Ser: 0.85 mg/dL (ref 0.44–1.00)
GFR calc Af Amer: >60 mL/min (ref >60)
GFR calc non Af Amer: >60 mL/min (ref >60)
Glucose, Bld: 114 mg/dL — ABNORMAL HIGH (ref 70–99)
Potassium: 3.9 mmol/L (ref 3.5–5.1)
Sodium: 137 mmol/L (ref 135–145)

## 2019-05-08 LAB — RESPIRATORY PANEL BY RT PCR (FLU A&B, COVID)
Influenza A by PCR: NEGATIVE
Influenza B by PCR: NEGATIVE
SARS Coronavirus 2 by RT PCR: POSITIVE — AB

## 2019-05-08 LAB — CBC WITH DIFFERENTIAL/PLATELET
Abs Immature Granulocytes: 0.02 10*3/uL (ref 0.00–0.07)
Basophils Absolute: 0 10*3/uL (ref 0.0–0.1)
Basophils Relative: 0 %
Eosinophils Absolute: 0.1 10*3/uL (ref 0.0–0.5)
Eosinophils Relative: 1 %
HCT: 38.9 % (ref 36.0–46.0)
Hemoglobin: 12.8 g/dL (ref 12.0–15.0)
Immature Granulocytes: 0 %
Lymphocytes Relative: 23 %
Lymphs Abs: 1.1 10*3/uL (ref 0.7–4.0)
MCH: 30 pg (ref 26.0–34.0)
MCHC: 32.9 g/dL (ref 30.0–36.0)
MCV: 91.1 fL (ref 80.0–100.0)
Monocytes Absolute: 0.5 10*3/uL (ref 0.1–1.0)
Monocytes Relative: 9 %
Neutro Abs: 3.3 10*3/uL (ref 1.7–7.7)
Neutrophils Relative %: 67 %
Platelets: 325 10*3/uL (ref 150–400)
RBC: 4.27 MIL/uL (ref 3.87–5.11)
RDW: 13.1 % (ref 11.5–15.5)
WBC: 4.9 10*3/uL (ref 4.0–10.5)
nRBC: 0 % (ref 0.0–0.2)

## 2019-05-08 LAB — BRAIN NATRIURETIC PEPTIDE: B Natriuretic Peptide: 33.3 pg/mL (ref 0.0–100.0)

## 2019-05-08 MED ORDER — ONDANSETRON HCL 4 MG/2ML IJ SOLN: 4.0000 mg | Freq: Once | INTRAMUSCULAR | Status: DC

## 2019-05-08 MED ORDER — ALBUTEROL SULFATE HFA 108 (90 BASE) MCG/ACT IN AERS
2.0000 | INHALATION_SPRAY | Freq: Once | RESPIRATORY_TRACT | Status: AC
  Administered 2019-05-08: 2 via RESPIRATORY_TRACT
  Filled 2019-05-08: qty 6.7

## 2019-05-08 NOTE — ED Triage Notes (Signed)
Patient presents from home by [facility] -reports productive cough(yellow) X 2 weeks -difficulty taking deep breaths -diagnosed with bronchitis on Monday by PCP -she was prescribed meds but does not know the names -denies known COVID exposure -denies smoking -denies fever -Reports shortness of breath with exertion -reports fatigue, loss of energy -loss of appetite-states "food states bad."

## 2019-05-08 NOTE — Discharge Instructions (Addendum)
You have tested POSITIVE for COVID 19 today. Please stay home and self isolate for 10 days (cleared: 05/18) Continue taking your hymet cough medication as needed. I have provided an inhaler that you can also use as needed.  Follow up with your PCP regarding your test result today.  Return to the ED IMMEDIATELY for any worsening symptoms including worsening shortness of breath, chest pain, coughing up blood, passing out.

## 2019-05-08 NOTE — ED Provider Notes (Signed)
[facility] Provider Note   CSN: [id number] Arrival date & time: 05/08/19  1111     History Chief Complaint  Patient presents with  . Shortness of Breath  . Cough    Yvette Diaz is a 64 y.o. female with PMHx HTN, HLD, GERD, fibromyalgia, diabetes who presents to the ED today via EMS for productive cough with yellow sputum x 2 weeks. Pt also complains of gradual onset, constant, worsening, shortness of breath. She reports being evaluated by her PCP on Monday and diagnosed with bronchitis and placed on doxycycline without relief. Pt does endorse hx of asthma however states she has not had to use an inhaler for over 5 years. Did not try using an old one during these past 2 weeks. Pt also complains of fatigue, appetite change, and that "food tastes bad." She is also having looser stools than normal which started today. She denies recent COVID 19 positive exposure. Pt is not vaccinated for COVID 19. Pt denies fevers, chills, chest pain, abdominal pain, vomiting, or any other associated symptoms.   The history is provided by the patient and medical records.       Past Medical History:  Diagnosis Date  . Antral gastritis 2006  . Anxiety   . Asthma   . Chronic back pain   . Chronic fatigue   . Chronic pain syndrome   . Depression   . Diabetes mellitus type II    controlled with diet  . Esophageal stricture   . Fatty liver   . Fibromyalgia   . GERD (gastroesophageal reflux disease)   . Gilbert's syndrome   . Hyperhidrosis   . Hyperlipidemia   . Hypertension   . Hypothyroid   . IBS (irritable bowel syndrome)   . Memory loss   . Papillary thyroid carcinoma ([place]) 2007   One radiation pill/ no chemo  . Sicca syndrome, unspecified ([place])   . Tremor, essential     Patient Active Problem List   Diagnosis Date Noted  . HYPERLIPIDEMIA 12/06/2007  . HYPERTENSION 12/06/2007  . ALLERGIC RHINITIS 11/21/2007  . ASTHMA 11/21/2007  . GERD  11/21/2007    Past Surgical History:  Procedure Laterality Date  . back surgeries     2 lumbar and 1 cervical   . BLADDER SUSPENSION  2012  . CHOLECYSTECTOMY  1982  . LAPAROSCOPY     x 3  . LAVH     30'S  . thyroid removed    . TONSILLECTOMY       OB History    Gravida  1   Para      Term      Preterm      AB  1   Living  0     SAB  1   TAB      Ectopic      Multiple      Live Births              Family History  Problem Relation Age of Onset  . Hypertension Mother   . Osteoporosis Mother   . Diabetes Mother   . Heart failure Mother   . Diabetes Father   . Hypertension Father   . Heart disease Father   . Diabetes Sister   . Hypertension Sister   . Breast cancer Sister 39  . Osteoporosis Sister   . Rheum arthritis Sister   . Diabetes Brother   . Hypertension Brother  Social History   Tobacco Use  . Smoking status: Never Smoker  . Smokeless tobacco: Never Used  Substance Use Topics  . Alcohol use: No    Alcohol/week: 0.0 standard drinks  . Drug use: No    Home Medications Prior to Admission medications   Medication Sig Start Date End Date Taking? Authorizing Provider  albuterol (PROAIR HFA) 108 (90 BASE) MCG/ACT inhaler Inhale 2 puffs into the lungs every 6 (six) hours as needed for wheezing.    Yes [provider]  amphetamine-dextroamphetamine (ADDERALL) 30 MG tablet Take 30 mg by mouth 2 (two) times daily. 06/25/16  Yes [provider]  Ascorbic Acid (VITAMIN C) 1000 MG tablet Take 500 mg by mouth daily.   Yes [provider]  cefUROXime (CEFTIN) 500 MG tablet Take 500 mg by mouth 2 (two) times daily. 05/06/19  Yes [provider]  cevimeline (EVOXAC) 30 MG capsule Take 30 mg by mouth 3 (three) times daily.     Yes [provider]  Cinnamon 500 MG TABS Take 1,000 mg by mouth daily.   Yes [provider]  cyclobenzaprine (FLEXERIL) 10 MG tablet Take 10 mg by mouth 3 (three) times  daily as needed for muscle spasms. 02/24/19  Yes [provider]  DULoxetine (CYMBALTA) 60 MG capsule Take 60 mg by mouth daily.     Yes [provider]  HYDROcodone-homatropine (HYCODAN) 5-1.5 MG/5ML syrup Take 5 mLs by mouth every 6 (six) hours as needed for cough. 05/07/19  Yes [provider]  irbesartan (AVAPRO) 300 MG tablet Take 300 mg by mouth at bedtime.     Yes [provider]  levothyroxine (SYNTHROID, LEVOTHROID) 137 MCG tablet Take 137 mcg by mouth daily before breakfast.   Yes [provider]  LINZESS 290 MCG CAPS capsule Take 290 mcg by mouth daily as needed for constipation. 03/03/19  Yes [provider]  LORazepam (ATIVAN) 0.5 MG tablet Take 0.5 mg by mouth 2 (two) times daily as needed for anxiety.    Yes [provider]  magnesium gluconate (MAGONATE) 500 MG tablet Take 500 mg by mouth daily.   Yes [provider]  metFORMIN (GLUCOPHAGE) 500 MG tablet Take 500-1,000 mg by mouth See admin instructions. Take 2 tablets every morning and take 1 tablet every evening 05/20/13  Yes [provider]  montelukast (SINGULAIR) 10 MG tablet Take 10 mg by mouth daily.    Yes [provider]  oxybutynin (DITROPAN) 5 MG tablet Take 1 tablet by mouth 2 (two) times daily as needed. 09/24/17  Yes [provider]  pantoprazole (PROTONIX) 40 MG tablet Take 40 mg by mouth at bedtime.  12/09/18  Yes [provider]  vitamin E (VITAMIN E) 180 MG (400 UNITS) capsule Take 400 Units by mouth daily.   Yes [provider]  bisoprolol (ZEBETA) 5 MG tablet Take 0.05 tablets by mouth daily. 12/24/18   [provider]  Lancets 28G [place]  09/19/10   [provider]    Allergies    Advair diskus [fluticasone-salmeterol], Prednisone, Cardizem [diltiazem hcl], Fish allergy, Iodine, Levsin [hyoscyamine sulfate], Other, Prinivil [lisinopril], Simvastatin, Sulfonamide derivatives, and Topamax  [topiramate]  Review of Systems   Review of Systems  Constitutional: Positive for fatigue. Negative for chills and fever.  Respiratory: Positive for cough and shortness of breath.   Cardiovascular: Negative for chest pain, palpitations and leg swelling.  Gastrointestinal: Positive for nausea (chronic). Negative for abdominal pain and vomiting.  All other  systems reviewed and are negative.   Physical Exam Updated Vital Signs BP (!) 115/53   Pulse 74   Temp 98.8 F (37.1 C) (Oral)   Resp 19   Ht 5\' 6"  (1.676 m)   Wt 78.9 kg   SpO2 100% Comment: 2 liters [place]  BMI 28.08 kg/m   Physical Exam Vitals and nursing note reviewed.  Constitutional:      Appearance: She is not ill-appearing or diaphoretic.  HENT:     Head: Normocephalic and atraumatic.  Eyes:     Conjunctiva/sclera: Conjunctivae normal.  Cardiovascular:     Rate and Rhythm: Normal rate and regular rhythm.     Pulses: Normal pulses.  Pulmonary:     Effort: Tachypnea present.     Breath sounds: Rhonchi present. No decreased breath sounds, wheezing or rales.     Comments: Able to speak in short sentences. Actively coughing between sentences. Satting 100% on RA.  Abdominal:     Palpations: Abdomen is soft.     Tenderness: There is no abdominal tenderness. There is no guarding or rebound.  Musculoskeletal:     Cervical back: Neck supple.     Right lower leg: No tenderness. No edema.     Left lower leg: No tenderness. No edema.  Skin:    General: Skin is warm and dry.  Neurological:     Mental Status: She is alert.     ED Results / Procedures / Treatments   Labs (all labs ordered are listed, but only abnormal results are displayed) Labs Reviewed  RESPIRATORY PANEL BY RT PCR (FLU A&B, COVID) - Abnormal; Notable for the following components:      Result Value   SARS Coronavirus 2 by RT PCR POSITIVE (*)    All other components within normal limits  BASIC METABOLIC PANEL - Abnormal; Notable for the following  components:   Glucose, Bld 114 (*)    Calcium 8.7 (*)    All other components within normal limits  CBC WITH DIFFERENTIAL/PLATELET  URINALYSIS, ROUTINE W REFLEX MICROSCOPIC  BRAIN NATRIURETIC PEPTIDE    EKG None  Radiology DG Chest Port 1 View  Result Date: 05/08/2019 CLINICAL DATA:  Cough, shortness of breath EXAM: PORTABLE CHEST 1 VIEW COMPARISON:  08/17/2015 FINDINGS: There is bilateral mild interstitial thickening. There is no focal consolidation. There is no pleural effusion or pneumothorax. The heart and mediastinal contours are unremarkable. There is no acute osseous abnormality. IMPRESSION: Bilateral mild interstitial thickening which may reflect mild interstitial edema versus infection. Electronically Signed   By: Kathreen Devoid   On: 05/08/2019 12:19    Procedures Procedures (including critical care time)  Medications Ordered in ED Medications  ondansetron (ZOFRAN) injection 4 mg (4 mg Intravenous Not Given 05/08/19 1340)  albuterol (VENTOLIN HFA) 108 (90 Base) MCG/ACT inhaler 2 puff (2 puffs Inhalation Given 05/08/19 1154)    ED Course  I have reviewed the triage vital signs and the nursing notes.  Pertinent labs & imaging results that were available during my care of the patient were reviewed by me and considered in my medical decision making (see chart for details).  Clinical Course as of May 07 1498  Fri May 08, 2019  1417 SARS Coronavirus 2 by RT PCR(!): POSITIVE [MV]  1417 SARS Coronavirus 2 by RT PCR(!): POSITIVE [MV]    Clinical Course User Index [MV] Eustaquio Maize, PA-C   MDM Rules/Calculators/A&P  64 year old female who presents to the ED today complaining of productive cough and shortness of breath for the past 2 weeks.  Also has fatigue, loss of energy, loss of appetite states foods taste bad as well as looser stool.  Denies any recent COVID-19 positive exposure.  She does have history of asthma but has not used an inhaler in several  years.  She has never had to be intubated or admitted for asthma exacerbation.  On arrival to the ED patient is afebrile, nontachycardic.  Mildly tachypneic and speaking in short sentences between actively coughing.  She is however satting 97% on room air.  She does have some rhonchorous breath sounds between coughing.  Will obtain chest x-ray as well as screening labs and Covid test at this time.  Patient has not been vaccinated.  She denies any chest pain.  I have low suspicion for ACS or PE.   Patient given multiple puffs of albuterol inhaler with improvement in her symptoms.  Chest x-ray with interstitial edema versus infection.  Have added on a BNP.  Major of labs reassuring including CBC and BMP.  Patient states she does not want to wait for her Covid test at this time.  It was ordered with hopes that if she was positive she could receive Mab here in the ED given comorbidities however patient states that she would need to speak with her husband who is currently out of town before receiving Mab.    Prior to patient being discharged her Covid test has returned positive.  She still wants time to think about whether Mab would be the right thing for her.  He was ambulated in the ED and pulse ox remained at 98%. Pt stable for discharge home. MAB has messaged me back and unfortunately pt is not a candidate for MAB given her sx have been ongoing for 2 weeks. Encouraged to self isolate and use albuterol inhaler PRN. Strict return precautions discussed. Pt is in agreement with plan and stable for discharge home.   This note was prepared using Dragon voice recognition software and may include unintentional dictation errors due to the inherent limitations of voice recognition software.   Final Clinical Impression(s) / ED Diagnoses Final diagnoses:  Lab test positive for detection of COVID-19 virus  Shortness of breath    Rx / DC Orders ED Discharge Orders    None       Discharge Instructions       You have tested POSITIVE for COVID 19 today. Please stay home and self isolate for 10 days (cleared: 05/18) Continue taking your hymet cough medication as needed. I have provided an inhaler that you can also use as needed.  Follow up with your PCP regarding your test result today.  Return to the ED IMMEDIATELY for any worsening symptoms including worsening shortness of breath, chest pain, coughing up blood, passing out.        Eustaquio Maize, PA-C 05/08/19 1500    Davonna Belling, MD 05/08/19 [id number]

## 2019-05-08 NOTE — ED Notes (Signed)
Pt ambulated with pulse oxymetry, oxygen kept on 98%the whole time

## 2019-05-09 ENCOUNTER — Telehealth: Payer: Self-pay | Admitting: Adult Health

## 2019-05-09 NOTE — Telephone Encounter (Signed)
Called to discuss with Yvette Diaz about Covid symptoms and the use of bamlanivimab, a monoclonal antibody infusion for those with mild to moderate Covid symptoms and at a high risk of hospitalization.     Pt does not qualify for infusion therapy as Yvette Diaz  symptoms first presented > 10 days prior to timing of infusion. Symptoms tier reviewed as well as criteria for ending isolation. Preventative practices reviewed. Patient verbalized understanding   Patient Active Problem List   Diagnosis Date Noted  . HYPERLIPIDEMIA 12/06/2007  . HYPERTENSION 12/06/2007  . ALLERGIC RHINITIS 11/21/2007  . ASTHMA 11/21/2007  . GERD 11/21/2007     Arthelia Callicott NP-C  [facility]    05/09/2019

## 2019-11-09 ENCOUNTER — Other Ambulatory Visit: Payer: Self-pay | Admitting: Family Medicine

## 2019-11-09 DIAGNOSIS — K760 Fatty (change of) liver, not elsewhere classified: Secondary | ICD-10-CM

## 2019-12-07 ENCOUNTER — Ambulatory Visit
Admission: RE | Admit: 2019-12-07 | Discharge: 2019-12-07 | Disposition: A | Discharge status: Home / Self Care | Payer: Medicare Other | Source: Ambulatory Visit | Attending: Family Medicine | Admitting: Family Medicine

## 2019-12-07 ENCOUNTER — Other Ambulatory Visit: Payer: Self-pay

## 2019-12-07 DIAGNOSIS — K760 Fatty (change of) liver, not elsewhere classified: Secondary | ICD-10-CM

## 2020-04-07 ENCOUNTER — Other Ambulatory Visit: Payer: Self-pay | Admitting: Family Medicine

## 2020-04-07 ENCOUNTER — Ambulatory Visit
Admission: RE | Admit: 2020-04-07 | Discharge: 2020-04-07 | Disposition: A | Discharge status: Home / Self Care | Payer: Medicare Other | Source: Ambulatory Visit | Attending: Family Medicine | Admitting: Family Medicine

## 2020-04-07 ENCOUNTER — Other Ambulatory Visit: Payer: Self-pay

## 2020-04-07 DIAGNOSIS — R059 Cough, unspecified: Secondary | ICD-10-CM

## 2020-10-24 ENCOUNTER — Encounter: Payer: Self-pay | Admitting: Gastroenterology

## 2021-07-03 IMAGING — DX DG CHEST 1V PORT
2 series · 2 of 2 positions shown · non-contrast
Comparison: 08/17/2015

CLINICAL DATA: Cough, shortness of breath

EXAM:
PORTABLE CHEST 1 VIEW

[chest ap (1 of 2)]
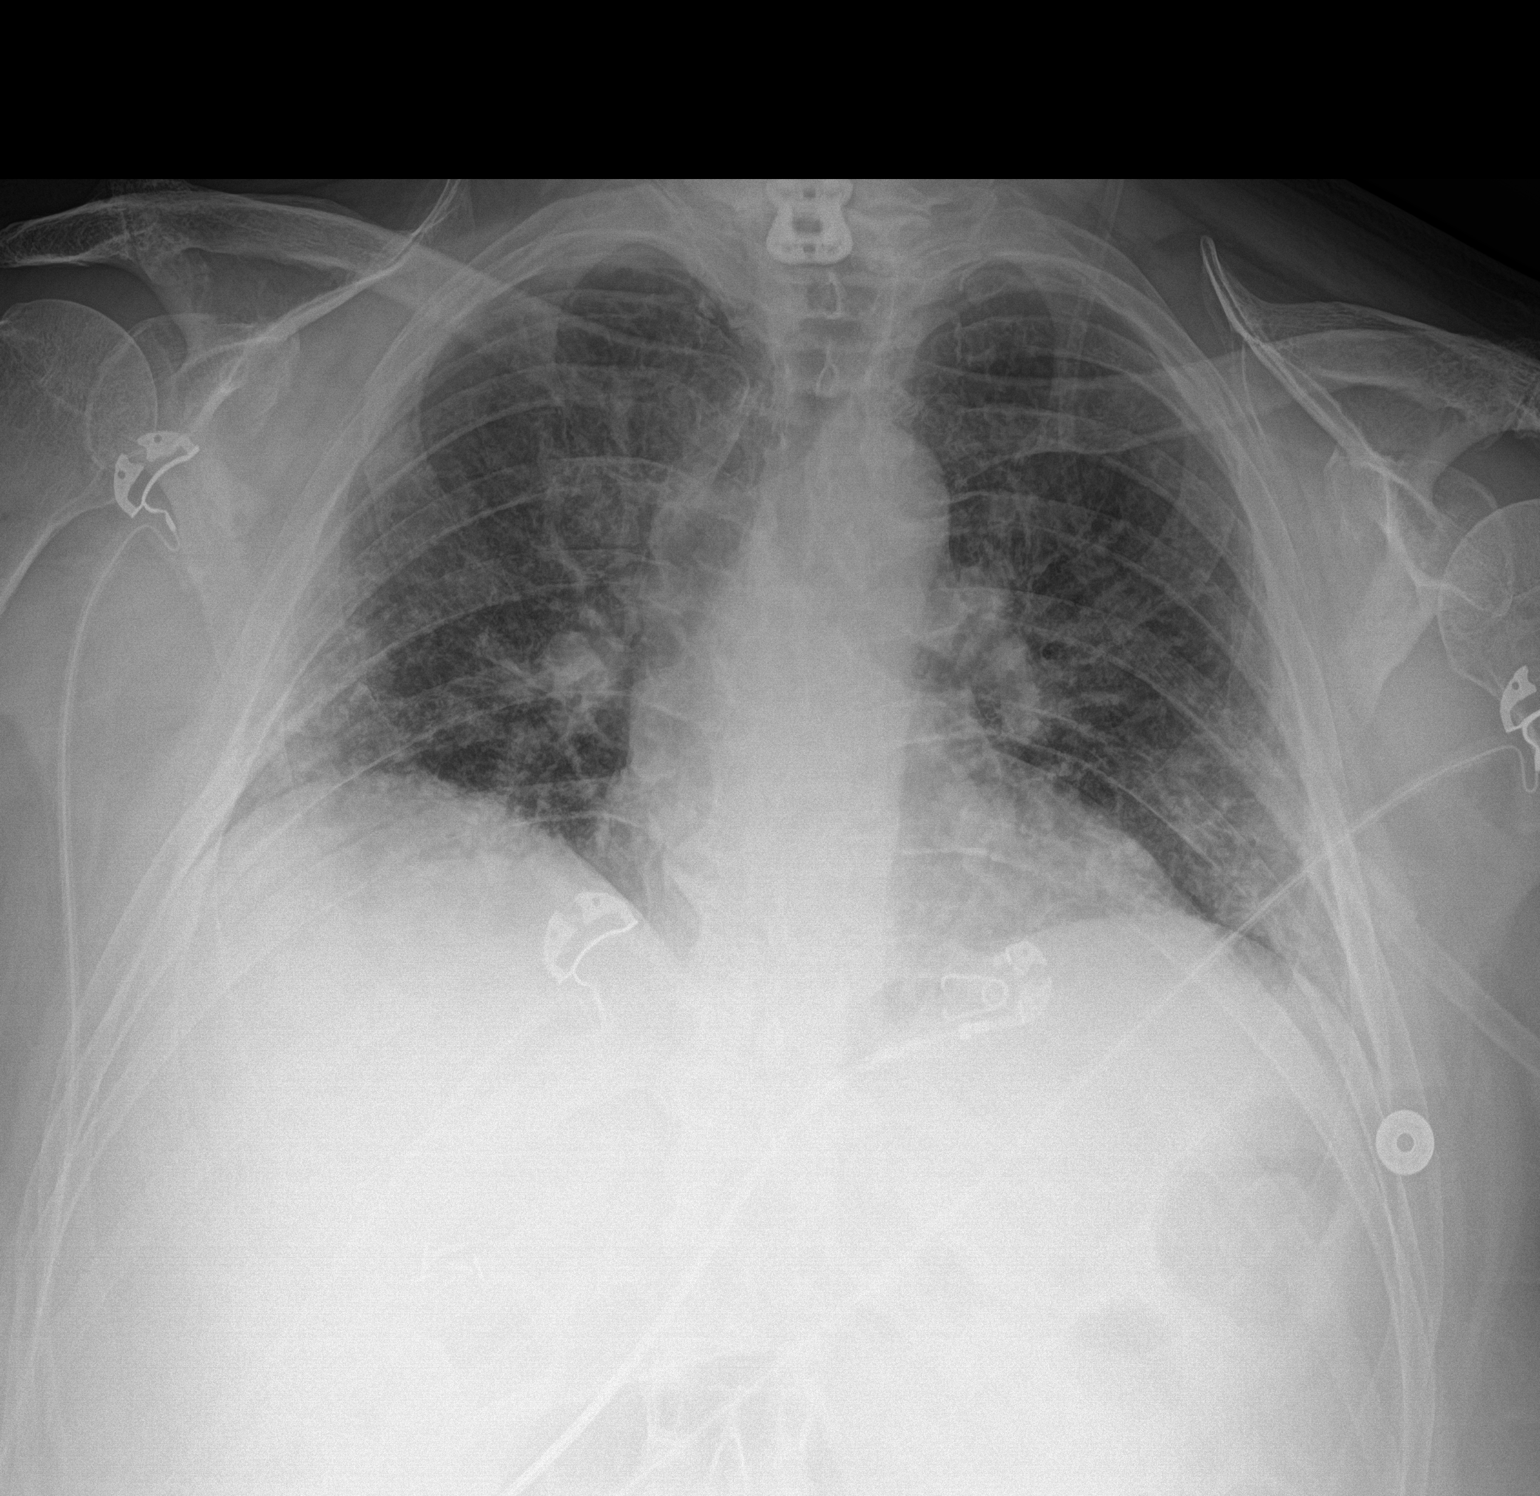

[chest ap (2 of 2)]
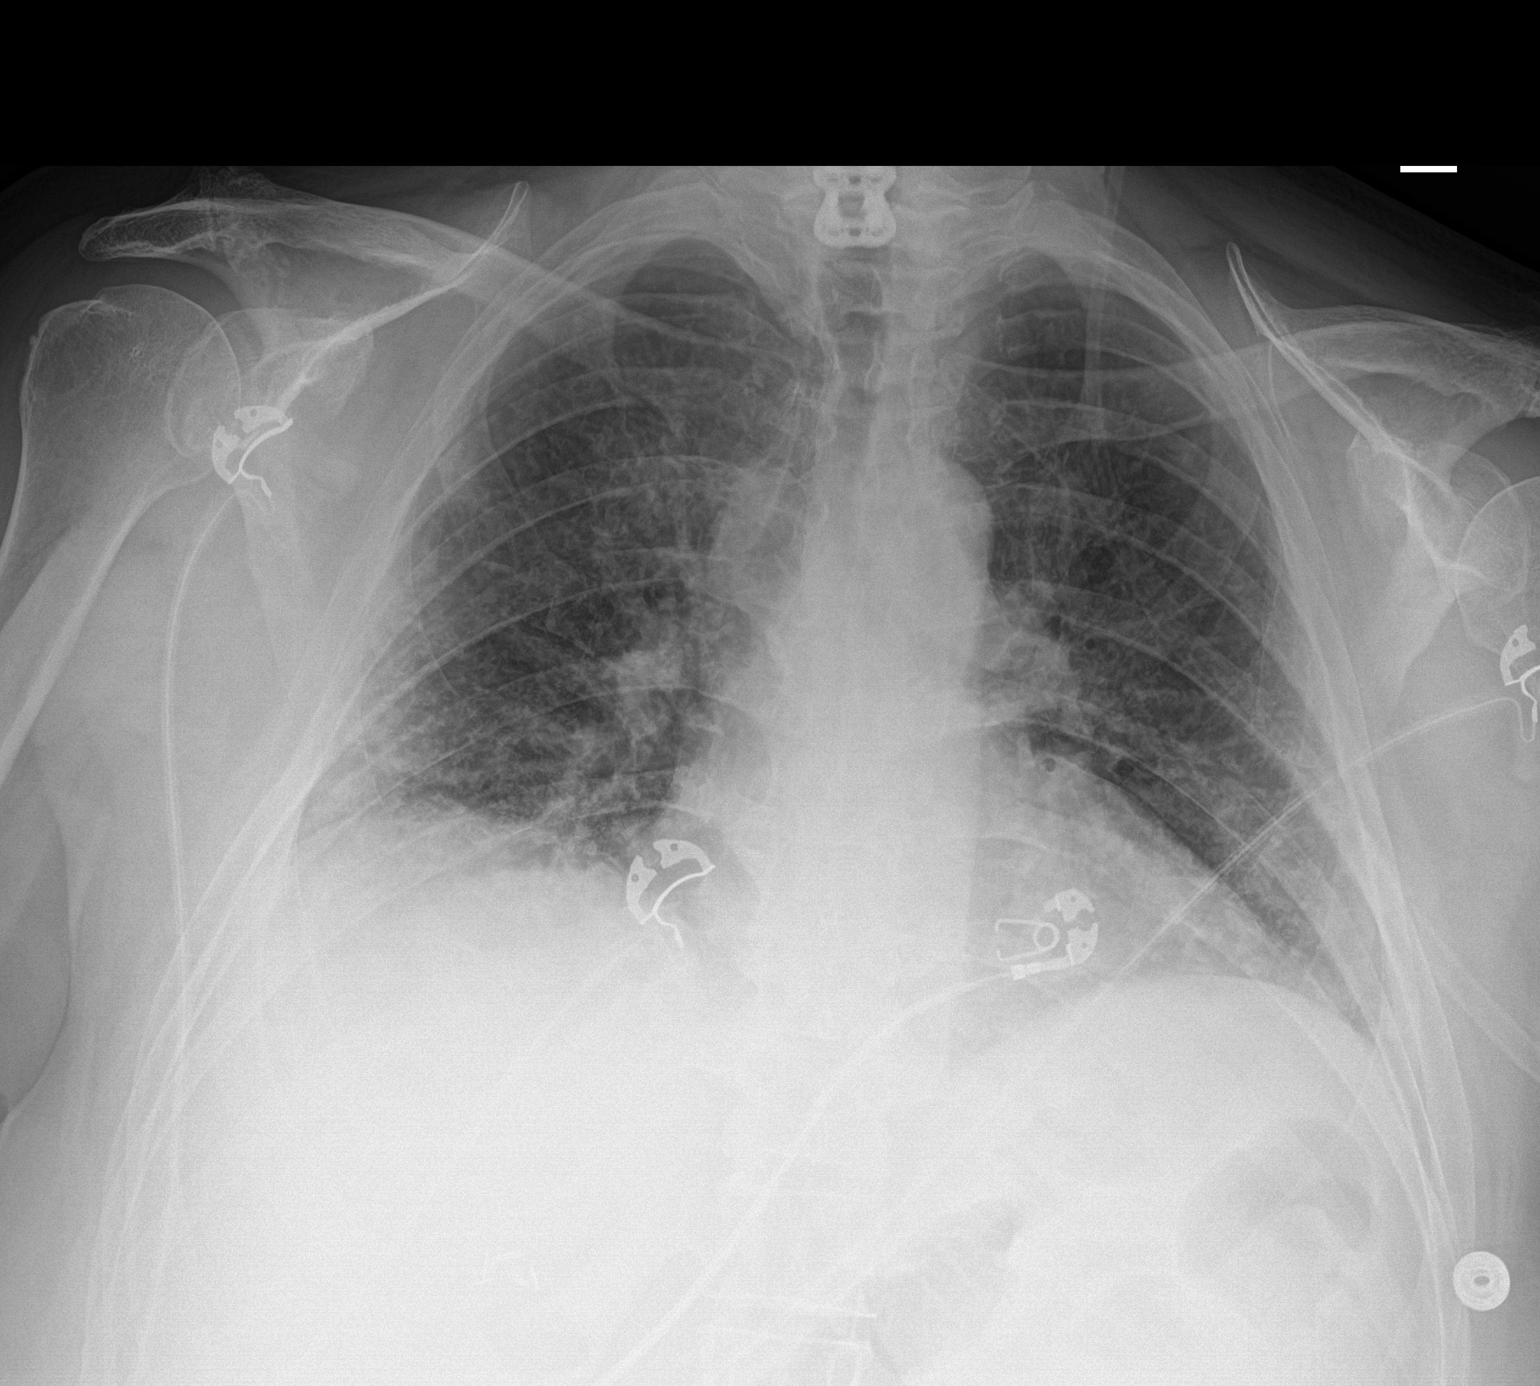

[2 of 2 positions shown; findings below may reference images not displayed]

FINDINGS: There is bilateral mild interstitial thickening. There is no focal
consolidation. There is no pleural effusion or pneumothorax. The
heart and mediastinal contours are unremarkable.

There is no acute osseous abnormality.
IMPRESSION: Bilateral mild interstitial thickening which may reflect mild
interstitial edema versus infection.
